# Patient Record
Sex: Female | Born: 1935 | Race: Black or African American | Hispanic: No | Smoking: Current some day smoker
Health system: Southern US, Community
[De-identification: ages and names within clinical notes are randomized; demographics above are authoritative.]

## PROBLEM LIST (undated history)

## (undated) ENCOUNTER — Ambulatory Visit: Admission: EM | Payer: 59

## (undated) DIAGNOSIS — E079 Disorder of thyroid, unspecified: Secondary | ICD-10-CM

## (undated) DIAGNOSIS — I519 Heart disease, unspecified: Secondary | ICD-10-CM

## (undated) DIAGNOSIS — D0510 Intraductal carcinoma in situ of unspecified breast: Principal | ICD-10-CM

## (undated) DIAGNOSIS — IMO0002 Reserved for concepts with insufficient information to code with codable children: Secondary | ICD-10-CM

## (undated) DIAGNOSIS — C50919 Malignant neoplasm of unspecified site of unspecified female breast: Secondary | ICD-10-CM

## (undated) DIAGNOSIS — I10 Essential (primary) hypertension: Secondary | ICD-10-CM

## (undated) HISTORY — DX: Malignant neoplasm of unspecified site of unspecified female breast: C50.919

## (undated) HISTORY — PX: CHOLECYSTECTOMY: SHX55

## (undated) HISTORY — DX: Heart disease, unspecified: I51.9

## (undated) HISTORY — DX: Intraductal carcinoma in situ of unspecified breast: D05.10

## (undated) HISTORY — DX: Reserved for concepts with insufficient information to code with codable children: IMO0002

## (undated) HISTORY — PX: MASTECTOMY: SHX3

## (undated) HISTORY — DX: Disorder of thyroid, unspecified: E07.9

## (undated) HISTORY — DX: Essential (primary) hypertension: I10

## (undated) HISTORY — PX: ABDOMINAL HYSTERECTOMY: SHX81

---

## 2001-03-03 ENCOUNTER — Ambulatory Visit (HOSPITAL_COMMUNITY): Admission: RE | Admit: 2001-03-03 | Discharge: 2001-03-03 | Payer: Self-pay | Admitting: Family Medicine

## 2001-03-03 ENCOUNTER — Encounter: Payer: Self-pay | Admitting: Family Medicine

## 2002-03-18 ENCOUNTER — Encounter: Payer: Self-pay | Admitting: Family Medicine

## 2002-03-18 ENCOUNTER — Ambulatory Visit (HOSPITAL_COMMUNITY): Admission: RE | Admit: 2002-03-18 | Discharge: 2002-03-18 | Payer: Self-pay | Admitting: Family Medicine

## 2003-03-22 ENCOUNTER — Encounter: Payer: Self-pay | Admitting: Family Medicine

## 2003-03-22 ENCOUNTER — Ambulatory Visit (HOSPITAL_COMMUNITY): Admission: RE | Admit: 2003-03-22 | Discharge: 2003-03-22 | Payer: Self-pay | Admitting: Family Medicine

## 2004-02-21 ENCOUNTER — Ambulatory Visit (HOSPITAL_COMMUNITY): Admission: RE | Admit: 2004-02-21 | Discharge: 2004-02-21 | Payer: Self-pay | Admitting: Ophthalmology

## 2004-03-24 ENCOUNTER — Ambulatory Visit (HOSPITAL_COMMUNITY): Admission: RE | Admit: 2004-03-24 | Discharge: 2004-03-24 | Payer: Self-pay | Admitting: Family Medicine

## 2004-06-05 ENCOUNTER — Ambulatory Visit (HOSPITAL_COMMUNITY): Admission: RE | Admit: 2004-06-05 | Discharge: 2004-06-05 | Payer: Self-pay | Admitting: Ophthalmology

## 2004-10-05 ENCOUNTER — Ambulatory Visit (HOSPITAL_COMMUNITY): Admission: RE | Admit: 2004-10-05 | Discharge: 2004-10-05 | Payer: Self-pay | Admitting: General Surgery

## 2005-01-21 ENCOUNTER — Emergency Department (HOSPITAL_COMMUNITY): Admission: EM | Admit: 2005-01-21 | Discharge: 2005-01-21 | Payer: Self-pay | Admitting: Emergency Medicine

## 2005-01-22 ENCOUNTER — Ambulatory Visit: Payer: Self-pay | Admitting: Orthopedic Surgery

## 2005-02-09 ENCOUNTER — Ambulatory Visit (HOSPITAL_COMMUNITY): Admission: RE | Admit: 2005-02-09 | Discharge: 2005-02-09 | Payer: Self-pay | Admitting: Family Medicine

## 2005-02-19 ENCOUNTER — Ambulatory Visit: Payer: Self-pay | Admitting: *Deleted

## 2005-02-19 ENCOUNTER — Ambulatory Visit: Payer: Self-pay | Admitting: Orthopedic Surgery

## 2005-03-07 ENCOUNTER — Ambulatory Visit (HOSPITAL_COMMUNITY): Admission: RE | Admit: 2005-03-07 | Discharge: 2005-03-07 | Payer: Self-pay | Admitting: *Deleted

## 2005-03-07 ENCOUNTER — Ambulatory Visit: Payer: Self-pay | Admitting: Cardiology

## 2005-03-07 ENCOUNTER — Ambulatory Visit: Payer: Self-pay | Admitting: *Deleted

## 2005-03-12 ENCOUNTER — Ambulatory Visit: Payer: Self-pay | Admitting: *Deleted

## 2005-05-23 ENCOUNTER — Ambulatory Visit: Payer: Self-pay | Admitting: Orthopedic Surgery

## 2005-10-22 DIAGNOSIS — C50919 Malignant neoplasm of unspecified site of unspecified female breast: Secondary | ICD-10-CM

## 2005-10-22 HISTORY — DX: Malignant neoplasm of unspecified site of unspecified female breast: C50.919

## 2005-12-17 ENCOUNTER — Ambulatory Visit: Payer: Self-pay | Admitting: Orthopedic Surgery

## 2005-12-19 ENCOUNTER — Emergency Department (HOSPITAL_COMMUNITY): Admission: EM | Admit: 2005-12-19 | Discharge: 2005-12-19 | Payer: Self-pay | Admitting: Emergency Medicine

## 2006-01-11 ENCOUNTER — Ambulatory Visit (HOSPITAL_COMMUNITY): Admission: RE | Admit: 2006-01-11 | Discharge: 2006-01-11 | Payer: Self-pay | Admitting: Family Medicine

## 2006-02-06 ENCOUNTER — Ambulatory Visit (HOSPITAL_COMMUNITY): Admission: RE | Admit: 2006-02-06 | Discharge: 2006-02-06 | Payer: Self-pay | Admitting: Family Medicine

## 2006-02-11 ENCOUNTER — Encounter: Admission: RE | Admit: 2006-02-11 | Discharge: 2006-02-11 | Payer: Self-pay | Admitting: Family Medicine

## 2006-02-11 ENCOUNTER — Encounter (INDEPENDENT_AMBULATORY_CARE_PROVIDER_SITE_OTHER): Payer: Self-pay | Admitting: *Deleted

## 2006-02-20 ENCOUNTER — Ambulatory Visit (HOSPITAL_COMMUNITY): Admission: RE | Admit: 2006-02-20 | Discharge: 2006-02-20 | Payer: Self-pay | Admitting: Family Medicine

## 2006-03-08 ENCOUNTER — Encounter: Admission: RE | Admit: 2006-03-08 | Discharge: 2006-03-08 | Payer: Self-pay | Admitting: General Surgery

## 2006-03-08 ENCOUNTER — Encounter (INDEPENDENT_AMBULATORY_CARE_PROVIDER_SITE_OTHER): Payer: Self-pay | Admitting: Diagnostic Radiology

## 2006-03-08 ENCOUNTER — Encounter (INDEPENDENT_AMBULATORY_CARE_PROVIDER_SITE_OTHER): Payer: Self-pay | Admitting: Specialist

## 2006-03-15 ENCOUNTER — Encounter (INDEPENDENT_AMBULATORY_CARE_PROVIDER_SITE_OTHER): Payer: Self-pay | Admitting: Specialist

## 2006-03-15 ENCOUNTER — Inpatient Hospital Stay (HOSPITAL_COMMUNITY): Admission: RE | Admit: 2006-03-15 | Discharge: 2006-03-18 | Payer: Self-pay | Admitting: General Surgery

## 2006-05-20 ENCOUNTER — Encounter: Admission: RE | Admit: 2006-05-20 | Discharge: 2006-05-20 | Payer: Self-pay | Admitting: Oncology

## 2006-05-20 ENCOUNTER — Ambulatory Visit (HOSPITAL_COMMUNITY): Payer: Self-pay | Admitting: Oncology

## 2006-08-26 ENCOUNTER — Encounter (HOSPITAL_COMMUNITY): Admission: RE | Admit: 2006-08-26 | Discharge: 2006-09-25 | Payer: Self-pay | Admitting: Oncology

## 2006-08-26 ENCOUNTER — Ambulatory Visit (HOSPITAL_COMMUNITY): Payer: Self-pay | Admitting: Oncology

## 2006-08-26 ENCOUNTER — Encounter: Admission: RE | Admit: 2006-08-26 | Discharge: 2006-08-26 | Payer: Self-pay | Admitting: Oncology

## 2007-02-13 ENCOUNTER — Ambulatory Visit (HOSPITAL_COMMUNITY): Admission: RE | Admit: 2007-02-13 | Discharge: 2007-02-13 | Payer: Self-pay | Admitting: Internal Medicine

## 2007-03-26 ENCOUNTER — Ambulatory Visit (HOSPITAL_COMMUNITY): Payer: Self-pay | Admitting: Oncology

## 2007-03-26 ENCOUNTER — Encounter (HOSPITAL_COMMUNITY): Admission: RE | Admit: 2007-03-26 | Discharge: 2007-04-25 | Payer: Self-pay | Admitting: Oncology

## 2007-05-16 ENCOUNTER — Ambulatory Visit (HOSPITAL_COMMUNITY): Admission: RE | Admit: 2007-05-16 | Discharge: 2007-05-16 | Payer: Self-pay | Admitting: Internal Medicine

## 2008-02-06 ENCOUNTER — Ambulatory Visit (HOSPITAL_COMMUNITY): Payer: Self-pay | Admitting: Oncology

## 2008-02-06 ENCOUNTER — Encounter (HOSPITAL_COMMUNITY): Admission: RE | Admit: 2008-02-06 | Discharge: 2008-03-07 | Payer: Self-pay | Admitting: Oncology

## 2008-04-06 ENCOUNTER — Ambulatory Visit (HOSPITAL_COMMUNITY): Admission: RE | Admit: 2008-04-06 | Discharge: 2008-04-06 | Payer: Self-pay | Admitting: Internal Medicine

## 2008-06-17 ENCOUNTER — Ambulatory Visit: Payer: Self-pay | Admitting: Orthopedic Surgery

## 2008-06-17 DIAGNOSIS — M545 Low back pain, unspecified: Secondary | ICD-10-CM | POA: Insufficient documentation

## 2008-06-17 DIAGNOSIS — M542 Cervicalgia: Secondary | ICD-10-CM

## 2008-06-24 ENCOUNTER — Encounter (HOSPITAL_COMMUNITY): Admission: RE | Admit: 2008-06-24 | Discharge: 2008-07-21 | Payer: Self-pay | Admitting: Orthopedic Surgery

## 2008-06-24 ENCOUNTER — Encounter: Payer: Self-pay | Admitting: Orthopedic Surgery

## 2008-07-22 ENCOUNTER — Encounter (HOSPITAL_COMMUNITY): Admission: RE | Admit: 2008-07-22 | Discharge: 2008-08-03 | Payer: Self-pay | Admitting: Orthopedic Surgery

## 2008-08-03 ENCOUNTER — Encounter: Payer: Self-pay | Admitting: Orthopedic Surgery

## 2008-08-06 ENCOUNTER — Ambulatory Visit (HOSPITAL_COMMUNITY): Payer: Self-pay | Admitting: Oncology

## 2009-06-20 ENCOUNTER — Ambulatory Visit (HOSPITAL_COMMUNITY): Admission: RE | Admit: 2009-06-20 | Discharge: 2009-06-20 | Payer: Self-pay | Admitting: Internal Medicine

## 2009-08-05 ENCOUNTER — Ambulatory Visit (HOSPITAL_COMMUNITY): Payer: Self-pay | Admitting: Oncology

## 2010-04-06 ENCOUNTER — Ambulatory Visit (HOSPITAL_COMMUNITY): Admission: RE | Admit: 2010-04-06 | Discharge: 2010-04-06 | Payer: Self-pay | Admitting: Internal Medicine

## 2010-08-22 ENCOUNTER — Ambulatory Visit (HOSPITAL_COMMUNITY): Admission: RE | Admit: 2010-08-22 | Discharge: 2010-08-22 | Payer: Self-pay | Admitting: Internal Medicine

## 2010-09-11 ENCOUNTER — Ambulatory Visit (HOSPITAL_COMMUNITY): Admission: RE | Admit: 2010-09-11 | Discharge: 2010-09-11 | Payer: Self-pay | Admitting: Internal Medicine

## 2010-11-08 ENCOUNTER — Ambulatory Visit (HOSPITAL_COMMUNITY): Admit: 2010-11-08 | Payer: Self-pay | Admitting: Oncology

## 2010-11-08 ENCOUNTER — Encounter (HOSPITAL_COMMUNITY): Admission: RE | Admit: 2010-11-08 | Payer: Self-pay | Source: Home / Self Care | Admitting: Oncology

## 2010-11-28 ENCOUNTER — Ambulatory Visit (HOSPITAL_COMMUNITY): Payer: Medicare Other | Admitting: Oncology

## 2010-11-28 DIAGNOSIS — C50919 Malignant neoplasm of unspecified site of unspecified female breast: Secondary | ICD-10-CM

## 2010-11-29 ENCOUNTER — Other Ambulatory Visit (HOSPITAL_COMMUNITY): Payer: Self-pay | Admitting: Oncology

## 2010-11-29 DIAGNOSIS — Z139 Encounter for screening, unspecified: Secondary | ICD-10-CM

## 2011-03-09 NOTE — Discharge Summary (Signed)
NAME:  Ariana Carney, Ariana Carney             ACCOUNT NO.:  [id number]   MEDICAL RECORD NO.:  [id number]          PATIENT TYPE:  INP   LOCATION:  A329                          FACILITY:  [facility]   PHYSICIAN:  Dirk Dress. Katrinka Blazing, M.D.   DATE OF BIRTH:  Sep 27, 1920   DATE OF ADMISSION:  03/15/2006  DATE OF DISCHARGE:  05/28/2007LH                                 DISCHARGE SUMMARY   DISCHARGE DIAGNOSES:  1.  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  2.  Hypertension.  3.  Irritable bowel syndrome.  4.  Hypothyroidism.  5.  Gastroesophageal reflux disease.   PROCEDURE:  Right total mastectomy with axillary node dissection on May 25.   DISPOSITION:  The patient discharged home in stable satisfactory condition.   DISCHARGE MEDICATIONS:  1.  Digoxin 0.25 mg daily.  2.  Carafate 1 g before meals and at bedtime.  3.  Levothroid 25 mcg daily.  4.  Lortab one every 4-6 hours as needed for pain.   FOLLOW UP:  The patient is scheduled to be seen in the office in 2 weeks.   HOSPITAL COURSE:  An 77 year old female with a history of abnormal mammogram  with initial stereotactic biopsy on April 23, which showed atypical ductal  hyperplasia with microcalcification.  The patient was scheduled to have a  needle localization with partial mastectomy initially, however, an MRI was  done and she was found to have a second lesion in the same breath with  repeat stereotactic biopsy showing ductal carcinoma in situ.  It was felt  that the patient had disease in two quadrants and that she would benefit  from a total mastectomy.  After much discussion with her and her daughters,  the patient was admitted for the surgery.  Other specifics of her history  are given in the admission note.  The patient was admitted through day  surgery and on May 25,  a right total mastectomy with node dissection was  done uneventfully.  The patient had a very smooth postoperative  course.  She  had no perioperative problems.  She had no complaints.  By the postop day  #2, her JP drainage was mostly serous.  She had no difficulty.  Hemoglobin  remained stable and she was discharged home on May 28, in satisfactory  condition.     Dirk Dress. Katrinka Blazing, M.D.  Electronically Signed    LCS/MEDQ  D:  04/21/2006  T:  04/22/2006  Job:  [id number]

## 2011-03-09 NOTE — Procedures (Signed)
NAME:  Ariana Carney, Ariana Carney NO.:  [id number]   MEDICAL RECORD NO.:  [id number]          PATIENT TYPE:  OUT   LOCATION:                                 FACILITY:   PHYSICIAN:  Vida Roller, M.D.   DATE OF BIRTH:  02/17/1920   DATE OF PROCEDURE:  03/07/2005  DATE OF DISCHARGE:                                  ECHOCARDIOGRAM   PROCEDURE:  Stress echocardiogram.   PRIMARY CARE PHYSICIAN:  Annia Friendly. Hill, MD   TAPE NUMBER:  SE [id number].   TAPE COUNT:  [id number]   CLINICAL INFORMATION:  This is an 77 year old woman with an abnormal  electrocardiogram with no previous cardiac history.   DETAILS OF THE PROCEDURE:  The patient was brought to the echocardiographic  laboratory where echocardiographic images were obtained in the apical-4,  apical-2, parasternal-long and parasternal-short axis views.  The patient  then underwent an exercise Bruce protocol and at peak exercise she was  immediately returned to the echocardiographic table where the same views  were obtained and obtained with the rest images to obtain an evaluation for  ischemia  The patient was then recovered appropriately for a Bruce protocol.   RESULTS:   IMAGES:  Rest images revealed normal systolic function with mild left  ventricular hypertrophy.  There are no wall motion abnormalities and the  ejection fraction is in excess of 60%.   STRESS IMAGES:  There was appropriate augmentation of the systolic function.  There was no evidence of exercise induced wall motion abnormality.   STRESS TEST:  The patient was able to exercise 7 minutes and 34 seconds of a  Bruce protocol attaining 10 METS of exercise.  Her heart rate increased from  75 beats/minute to 144 beats/minute, which is 105% of her maximum predicted  heart rate for her age.  Her blood pressure went from 130/68 to 172/78 given  an interval product of 24,000.  During that time she had no chest pain or  shortness of breath. She did have an occasional  ventricular couplet.  There  was some nonspecific ST-T wave changes, mostly in the inferior lateral  leads, which were not diagnostic for ischemia.  The reason for stopping the  test was fatigue and attaining greater than 100% of the maximum predicted  heart rate.   OVERALL INTERPRETATION:  This is an extremely low risk stress echocardiogram  in a patient with no known coronary artery disease, clinical correlation is  advised, but there is no evidence of ischemia or scar formation; and the  likelihood of obstructive coronary disease is extremely low.      JH/MEDQ  D:  03/07/2005  T:  03/07/2005  Job:  [id number]   cc:   Annia Friendly. Loleta Chance, MD  [address]  [place]  [place] [address]  Fax: [phone]

## 2011-03-09 NOTE — H&P (Signed)
NAME:  Ariana Carney, Ariana Carney             ACCOUNT NO.:  [id number]   MEDICAL RECORD NO.:  [id number]          PATIENT TYPE:  AMB   LOCATION:  DAY                           FACILITY:  [facility]   PHYSICIAN:  Jerolyn Shin C. Katrinka Blazing, M.D.   DATE OF BIRTH:  [birth year]   DATE OF ADMISSION:  DATE OF DISCHARGE:  LH                                HISTORY & PHYSICAL   HISTORY OF PRESENT ILLNESS:  An 77 year old female with a history of an  abnormal mammogram with initial stereotactic biopsy on February 11, 2006  showing atypical ductal hyperplasia with microcalcification.  The patient  was scheduled to have needle localization with partial mastectomy initially;  however, after MRI, she was found to have a second lesion in the same breast  with a repeat stereotactic biopsy showing ductal carcinoma in situ.  Sister  lesions were involving 2 quadrants.  It was decided that the patient would  benefit from a total mastectomy rather than wide excision of 2 biopsies.  It  is felt that partial removal of 2 quadrants of her breast on the same side  would leave a very displeasing cosmetic deformity, and the patient would  still need to have follow up radiation therapy.  With total mastectomy, she  would not need to have radiation therapy with this early lesion, and surgery  probably would be therapeutic.  This was discussed at length with the  patient and her daughters.  They agreed to proceed with total mastectomy and  node dissection.   PAST HISTORY:  1.  Hypertension.  2.  Irritable bowel syndrome.  3.  Hypothyroidism.  4.  Gastroesophageal reflux disease.  5.  Osteoarthritis.   MEDICATIONS:  1.  Digoxin 0.25 mg daily.  2.  Uniretic 7.5/12.5 daily.  3.  Hydrocodone 7.5/500 q.4h. p.r.n.   SURGERY:  1.  Exploratory laparotomy with closure.  2.  Perforated duodenal ulcer.  3.  Cholecystectomy.   PHYSICAL EXAMINATION:  GENERAL:  An elderly female in no acute distress.  VITAL SIGNS:  Blood pressure of 136/74, pulse  78, respirations 20, weight  117 pounds.  HEENT:  Unremarkable.  NECK:  Supple.  No JVD, bruit or adenopathy.  CHEST:  Clear to auscultation.  HEART:  Regular rate and rhythm without murmur, gallop, or rub.  ABDOMEN:  Soft, nontender, no masses.  BREASTS:  Palpable mass in the right upper outer quadrant with increased  ecchymosis and large hematoma in the right lower outer quadrant of the right  breast.  No palpable lymphadenopathy.  Left breast normal.  EXTREMITIES:  No clubbing, cyanosis, or edema.  NEUROLOGIC:  No focal motor, sensory or cerebellar deficit.   IMPRESSION:  1.  Multicentric ductal carcinoma in situ, right breast.  2.  Hypertension.  3.  Irritable bowel syndrome.  4.  Hypothyroidism.  5.  Gastroesophageal reflux disease.   PLAN:  Right total mastectomy with node dissection.      Dirk Dress. Katrinka Blazing, M.D.  Electronically Signed     LCS/MEDQ  D:  03/14/2006  T:  03/14/2006  Job:  [id number]

## 2011-03-09 NOTE — Op Note (Signed)
NAME:  Ariana Carney, Ariana Carney             ACCOUNT NO.:  [id number]   MEDICAL RECORD NO.:  [id number]          PATIENT TYPE:  INP   LOCATION:  A329                          FACILITY:  [facility]   PHYSICIAN:  Dirk Dress. Katrinka Blazing, M.D.   DATE OF BIRTH:  June 27, 1920   DATE OF PROCEDURE:  03/15/2006  DATE OF DISCHARGE:  03/18/2006                                 OPERATIVE REPORT   PREOPERATIVE DIAGNOSIS:  Multicentric right breast carcinoma.   POSTOPERATIVE DIAGNOSIS:  Multicentric right breast carcinoma.   PROCEDURE:  Right total mastectomy with axillary node dissection.   SURGEON:  Dirk Dress. Katrinka Blazing, M.D.   DESCRIPTION:  Under general anesthesia, the patient's right breast, axilla,  arm and neck were prepped and draped in a sterile field.  An elliptical  incision was made with a transverse orientation, with slight angulation  towards the right axilla along the lateral pectoral border.  Upper and lower  skin flaps were developed.  Dissection was continued down to the chest wall.  The breast was then separated from the pectoralis fascia without difficulty.  The dissection was continued over to the lateral pectoralis margin.  Dissection was then extended into the axilla down to the deep axillary  fascia.  All fibroareolar tissue inferior to the axillary vein was  dissected.  Vessels were clipped with hemoclips.  Visible lymphatics were  clipped with hemoclips and divided.  Axillary contents were removed.  Thoracodorsal neurovascular bundle and the long thoracic nerve of Bell were  preserved.  Irrigation was carried out.  Two JP drains were placed.  These  were secured with 3-0 nylon.  The skin and subcutaneous tissue were then  closed over the drains using 2-0 Monocryl , 3-0 Monocryl and staples.  The  drains were hooked to suction.  Dressings were placed.  The patient  tolerated the procedure well.  She was awakened from anesthesia  uneventfully, transferred to a bed and taken to the postanesthetic care  unit  in satisfactory condition.      Dirk Dress. Katrinka Blazing, M.D.  Electronically Signed     LCS/MEDQ  D:  04/21/2006  T:  04/22/2006  Job:  [id number]

## 2011-03-09 NOTE — Procedures (Signed)
NAME:  STEELE, LEDONNE NO.:  [id number]   MEDICAL RECORD NO.:  [id number]          PATIENT TYPE:  OUT   LOCATION:  RAD                           FACILITY:  [facility]   PHYSICIAN:  Vida Roller, M.D.   DATE OF BIRTH:  02/17/1920   DATE OF PROCEDURE:  03/07/2005  DATE OF DISCHARGE:                                    STRESS TEST   HISTORY:  Ms. Janvrin is an 77 year old female with no known coronary disease  who was preoperative for a knee surgery.  Her cardiac risk factors include  tobacco abuse, age, and hypertension.   Her resting electrocardiogram reveals a sinus rhythm at 65 beats per minute  with some nonspecific ST abnormalities.  Blood pressure is 130/68.   Patient exercised for a total of 7 minutes and 34 seconds Bruce protocol  stage III and 10.1 METS.  Maximum heart rate achieved was 144 beats per  minute which is 105% of predicted maximum.  Maximum blood pressure is 172/78  and resolved down to 138/70 in recovery.  The patient denied any chest  discomfort or shortness of breath with exercise.  Exercise was stopped  secondary to fatigue and leg discomfort.  Electrocardiogram revealed one  ventricular couplet in recovery.  She had mild ST depression in the  inferolateral leads which is possibly related to rate.   Echocardiographic images and final results are pending M.D. review.      AB/MEDQ  D:  03/07/2005  T:  03/07/2005  Job:  [id number]

## 2011-07-17 LAB — COMPREHENSIVE METABOLIC PANEL
AST: 21
Albumin: 4
Calcium: 9.2
Chloride: 102
Creatinine, Ser: 0.82
GFR calc Af Amer: >60
Sodium: 138

## 2011-07-17 LAB — CBC
MCHC: 35.2
MCV: 92.4
Platelets: 245
WBC: 3.9 — ABNORMAL LOW

## 2011-07-30 ENCOUNTER — Ambulatory Visit (HOSPITAL_COMMUNITY): Payer: Medicare Other | Admitting: Oncology

## 2011-07-31 ENCOUNTER — Encounter (HOSPITAL_COMMUNITY): Payer: Medicare Other | Admitting: Oncology

## 2011-08-09 LAB — CBC
Hemoglobin: 11.2 — ABNORMAL LOW
MCHC: 34.7
MCV: 93.6
RBC: 3.46 — ABNORMAL LOW

## 2011-08-09 LAB — COMPREHENSIVE METABOLIC PANEL
BUN: 12
CO2: 27
Calcium: 8.9
Creatinine, Ser: 0.66
GFR calc non Af Amer: >60
Glucose, Bld: 85

## 2011-08-22 ENCOUNTER — Encounter (HOSPITAL_COMMUNITY): Payer: Self-pay | Admitting: Oncology

## 2011-08-22 ENCOUNTER — Encounter (HOSPITAL_COMMUNITY): Payer: Medicare Other | Attending: Oncology | Admitting: Oncology

## 2011-08-22 VITALS — BP 157/78 | HR 65 | Temp 97.7°F | Ht 62.0 in | Wt 133.0 lb

## 2011-08-22 DIAGNOSIS — D0510 Intraductal carcinoma in situ of unspecified breast: Secondary | ICD-10-CM | POA: Insufficient documentation

## 2011-08-22 DIAGNOSIS — D0590 Unspecified type of carcinoma in situ of unspecified breast: Secondary | ICD-10-CM

## 2011-08-22 HISTORY — DX: Intraductal carcinoma in situ of unspecified breast: D05.10

## 2011-08-22 NOTE — Progress Notes (Signed)
[name], MD [address] [place] [address]  1. DCIS (ductal carcinoma in situ) of right breast     CURRENT THERAPY: S/P modified radical mastectomy on 03/15/2006 followed by Tamoxifen for a while, but discontinued due to intolerance in April 2009.  INTERVAL HISTORY: STORY CONTI 77 y.o. female returns for  regular  visit for followup of DCIS of right breast.  Patient continues to cook.  It appears as though the patient leads a sedentary lifestyle.  She reports that she passes time by watching television.  She denies any complaints this morning.  She explains that she feels good.  She saw Dr. Ouida Sills this morning he he gave her a good report.  She explains that her bowels are operating appropriately and she denies any urinary complaints.   She does reports some tenderness near the left mastectomy site, but she associates it with arthritis.    Past Medical History  Diagnosis Date  . Heart disease   . Hypertension   . Ulcer   . Thyroid disease   . Breast cancer 2007    right  . 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      has no known allergies.  Ms. Heater does not currently have medications on file.  Past Surgical History  Procedure Date  . Cholecystectomy   . Abdominal hysterectomy     Denies any headaches, dizziness, double vision, fevers, chills, night sweats, nausea, vomiting, diarrhea, constipation, chest pain, heart palpitations, shortness of breath, blood in stool, black tarry stool, urinary pain, urinary burning, urinary frequency, hematuria.   PHYSICAL EXAMINATION  ECOG PERFORMANCE STATUS: 1 - Symptomatic but completely ambulatory  Filed Vitals:   08/22/11 1048  BP: 157/78  Pulse: 65  Temp: 97.7 F (36.5 C)    GENERAL:alert, no distress, well nourished, well developed, comfortable, cooperative and  smiling SKIN: skin color, texture, turgor are normal HEAD: Normocephalic EYES: normal EARS: External ears normal OROPHARYNX:edentulous and mucous membranes are moist  NECK: supple, no adenopathy, non-tender, trachea midline LYMPH:  no palpable lymphadenopathy BREAST:left breast normal without mass, skin or nipple changes or axillary nodes,right  post-mastectomy site well healed and free of suspicious changes with a slight tightness of the scar in the superior portion.  No point tenderness noted.  No reproduction of the discomfort on palpation.  No worrisome findings. LUNGS: clear to auscultation , decreased breath sounds throughout. HEART: regular rate & rhythm, no murmurs, no gallops, S1 normal and S2 normal ABDOMEN:abdomen soft, non-tender and normal bowel sounds BACK: Back symmetric, no curvature. EXTREMITIES:less then 2 second capillary refill, no joint deformities, effusion, or inflammation, no edema, no skin discoloration, no clubbing, no cyanosis  NEURO: alert & oriented x 3 with fluent speech, no focal motor/sensory deficits, gait normal   RADIOGRAPHIC STUDIES:  09/11/2010  Clinical Data: Right side chest pain. Lower right rib pain.  RIGHT RIBS AND CHEST - 3+ VIEW  Comparison: Chest 04/06/2010.  Findings: The patient is status post right mastectomy and axillary  dissection. Lungs are emphysematous. There is basilar atelectasis  or scar. No pneumothorax. There is a fracture of the right ninth  rib which appears acute. There is also likely a fracture of the  right eighth rib.  IMPRESSION:  1. Right eighth and ninth rib fractures.  2. No pneumothorax.  3. COPD.  4. Right mastectomy and axillary dissection.  Provider: Melanie Crazier   PATHOLOGY: 1. Right breast, modified radical mastectomy-in situ ductal carcinoma, multifocal with negative surgical margins of resection. No invasive carcinoma identified. No tumor identified in 9 right axillary lymph nodes. Intermediate grade.  Necrosis identified. ER 96% positive PR 9% positive.    ASSESSMENT:  1. Right sided DCIS, S/P modified radical mastectomy on right followed by approximatekly 2 years of tamoxifen which was discontinued in April 2009 due to intolerance. 2. Post-surgical discomfort, possibly due to tight scar in the superior portion of mastectomy site versus arthritis secondary to fibrosis from the surgery   PLAN:  1. We will ascertain lab work from Dr. Alonza Smoker office 2. Return in 1 year for follow-up.   All questions were answered. The patient knows to call the clinic with any problems, questions or concerns. We can certainly see the patient much sooner if necessary.  The patient and plan discussed with Glenford Peers, MD and he is in agreement with the aforementioned.   Natalyn Szymanowski

## 2011-08-22 NOTE — Patient Instructions (Signed)
[facility]  Discharge Instructions  RECOMMENDATIONS MADE BY THE CONSULTANT AND ANY TEST RESULTS WILL BE SENT TO YOUR REFERRING DOCTOR.   EXAM FINDINGS BY MD TODAY AND SIGNS AND SYMPTOMS TO REPORT TO CLINIC OR PRIMARY MD: you are doing well.  Report any lumps, bone pain or shortness of breath.  MEDICATIONS PRESCRIBED: none   SPECIAL INSTRUCTIONS/FOLLOW-UP: Return to Clinic in 1 year.  Have Dr. Ouida Sills send [place] copies of any lab work that you have done.   I acknowledge that I have been informed and understand all the instructions given to me and received a copy. I do not have any more questions at this time, but understand that I may call the Specialty Clinic at [facility] at [phone] during business hours should I have any further questions or need assistance in obtaining follow-up care.    __________________________________________  _____________  __________ Signature of Patient or Authorized Representative            Date                   Time    __________________________________________ Nurse's Signature

## 2011-08-23 ENCOUNTER — Ambulatory Visit (HOSPITAL_COMMUNITY): Payer: Medicare Other | Admitting: Oncology

## 2011-08-27 ENCOUNTER — Ambulatory Visit (HOSPITAL_COMMUNITY): Payer: Medicare Other | Attending: Oncology

## 2011-10-04 ENCOUNTER — Ambulatory Visit (HOSPITAL_COMMUNITY)
Admission: RE | Admit: 2011-10-04 | Discharge: 2011-10-04 | Disposition: A | Discharge status: Home / Self Care | Payer: Medicare Other | Source: Ambulatory Visit | Attending: Oncology | Admitting: Oncology

## 2011-10-04 DIAGNOSIS — Z139 Encounter for screening, unspecified: Secondary | ICD-10-CM

## 2011-10-04 DIAGNOSIS — Z1231 Encounter for screening mammogram for malignant neoplasm of breast: Secondary | ICD-10-CM | POA: Insufficient documentation

## 2011-11-05 DIAGNOSIS — E538 Deficiency of other specified B group vitamins: Secondary | ICD-10-CM | POA: Diagnosis not present

## 2011-12-10 DIAGNOSIS — E538 Deficiency of other specified B group vitamins: Secondary | ICD-10-CM | POA: Diagnosis not present

## 2012-01-01 DIAGNOSIS — G5790 Unspecified mononeuropathy of unspecified lower limb: Secondary | ICD-10-CM | POA: Diagnosis not present

## 2012-01-01 DIAGNOSIS — I10 Essential (primary) hypertension: Secondary | ICD-10-CM | POA: Diagnosis not present

## 2012-01-01 DIAGNOSIS — N318 Other neuromuscular dysfunction of bladder: Secondary | ICD-10-CM | POA: Diagnosis not present

## 2012-01-16 DIAGNOSIS — R209 Unspecified disturbances of skin sensation: Secondary | ICD-10-CM | POA: Diagnosis not present

## 2012-01-16 DIAGNOSIS — I10 Essential (primary) hypertension: Secondary | ICD-10-CM | POA: Diagnosis not present

## 2012-01-16 DIAGNOSIS — E039 Hypothyroidism, unspecified: Secondary | ICD-10-CM | POA: Diagnosis not present

## 2012-01-30 DIAGNOSIS — R209 Unspecified disturbances of skin sensation: Secondary | ICD-10-CM | POA: Diagnosis not present

## 2012-03-03 DIAGNOSIS — E538 Deficiency of other specified B group vitamins: Secondary | ICD-10-CM | POA: Diagnosis not present

## 2012-04-07 DIAGNOSIS — I10 Essential (primary) hypertension: Secondary | ICD-10-CM | POA: Diagnosis not present

## 2012-04-07 DIAGNOSIS — N318 Other neuromuscular dysfunction of bladder: Secondary | ICD-10-CM | POA: Diagnosis not present

## 2012-04-15 DIAGNOSIS — R209 Unspecified disturbances of skin sensation: Secondary | ICD-10-CM | POA: Diagnosis not present

## 2012-04-15 DIAGNOSIS — I10 Essential (primary) hypertension: Secondary | ICD-10-CM | POA: Diagnosis not present

## 2012-04-15 DIAGNOSIS — E039 Hypothyroidism, unspecified: Secondary | ICD-10-CM | POA: Diagnosis not present

## 2012-05-15 DIAGNOSIS — E538 Deficiency of other specified B group vitamins: Secondary | ICD-10-CM | POA: Diagnosis not present

## 2012-06-16 DIAGNOSIS — E538 Deficiency of other specified B group vitamins: Secondary | ICD-10-CM | POA: Diagnosis not present

## 2012-07-21 DIAGNOSIS — E538 Deficiency of other specified B group vitamins: Secondary | ICD-10-CM | POA: Diagnosis not present

## 2012-08-22 ENCOUNTER — Ambulatory Visit (HOSPITAL_COMMUNITY): Payer: Medicare Other | Admitting: Oncology

## 2012-08-25 ENCOUNTER — Ambulatory Visit (HOSPITAL_COMMUNITY): Payer: Medicare Other | Admitting: Oncology

## 2012-09-01 ENCOUNTER — Other Ambulatory Visit (HOSPITAL_COMMUNITY): Payer: Self-pay | Admitting: Oncology

## 2012-09-01 DIAGNOSIS — Z139 Encounter for screening, unspecified: Secondary | ICD-10-CM

## 2012-09-02 ENCOUNTER — Other Ambulatory Visit (HOSPITAL_COMMUNITY): Payer: Self-pay | Admitting: Oncology

## 2012-09-02 ENCOUNTER — Ambulatory Visit (HOSPITAL_COMMUNITY)
Admission: RE | Admit: 2012-09-02 | Discharge: 2012-09-02 | Disposition: A | Discharge status: Home / Self Care | Payer: Medicare Other | Source: Ambulatory Visit | Attending: Oncology | Admitting: Oncology

## 2012-09-02 ENCOUNTER — Encounter (HOSPITAL_COMMUNITY): Payer: Medicare Other | Attending: Oncology | Admitting: Oncology

## 2012-09-02 ENCOUNTER — Encounter (HOSPITAL_COMMUNITY): Payer: Self-pay | Admitting: Oncology

## 2012-09-02 ENCOUNTER — Other Ambulatory Visit (HOSPITAL_COMMUNITY): Payer: Self-pay | Admitting: *Deleted

## 2012-09-02 VITALS — BP 139/85 | HR 79 | Temp 98.0°F | Resp 18 | Wt 125.0 lb

## 2012-09-02 DIAGNOSIS — R5383 Other fatigue: Secondary | ICD-10-CM | POA: Insufficient documentation

## 2012-09-02 DIAGNOSIS — F17200 Nicotine dependence, unspecified, uncomplicated: Secondary | ICD-10-CM | POA: Diagnosis not present

## 2012-09-02 DIAGNOSIS — Z853 Personal history of malignant neoplasm of breast: Secondary | ICD-10-CM | POA: Diagnosis not present

## 2012-09-02 DIAGNOSIS — R059 Cough, unspecified: Secondary | ICD-10-CM | POA: Diagnosis not present

## 2012-09-02 DIAGNOSIS — R634 Abnormal weight loss: Secondary | ICD-10-CM | POA: Insufficient documentation

## 2012-09-02 DIAGNOSIS — D0510 Intraductal carcinoma in situ of unspecified breast: Secondary | ICD-10-CM

## 2012-09-02 DIAGNOSIS — E538 Deficiency of other specified B group vitamins: Secondary | ICD-10-CM | POA: Diagnosis not present

## 2012-09-02 DIAGNOSIS — D0590 Unspecified type of carcinoma in situ of unspecified breast: Secondary | ICD-10-CM | POA: Diagnosis not present

## 2012-09-02 DIAGNOSIS — J4489 Other specified chronic obstructive pulmonary disease: Secondary | ICD-10-CM | POA: Insufficient documentation

## 2012-09-02 DIAGNOSIS — I10 Essential (primary) hypertension: Secondary | ICD-10-CM | POA: Insufficient documentation

## 2012-09-02 DIAGNOSIS — R05 Cough: Secondary | ICD-10-CM | POA: Insufficient documentation

## 2012-09-02 DIAGNOSIS — Z09 Encounter for follow-up examination after completed treatment for conditions other than malignant neoplasm: Secondary | ICD-10-CM | POA: Insufficient documentation

## 2012-09-02 DIAGNOSIS — J449 Chronic obstructive pulmonary disease, unspecified: Secondary | ICD-10-CM | POA: Diagnosis not present

## 2012-09-02 DIAGNOSIS — R5381 Other malaise: Secondary | ICD-10-CM | POA: Diagnosis not present

## 2012-09-02 LAB — CBC WITH DIFFERENTIAL/PLATELET
Basophils Relative: 0 % (ref 0–1)
Eosinophils Absolute: 0 10*3/uL (ref 0.0–0.7)
Lymphs Abs: 1.2 10*3/uL (ref 0.7–4.0)
MCH: 29.4 pg (ref 26.0–34.0)
MCHC: 33.8 g/dL (ref 30.0–36.0)
Neutrophils Relative %: 71 % (ref 43–77)
Platelets: 243 10*3/uL (ref 150–400)
RBC: 4.7 MIL/uL (ref 3.87–5.11)

## 2012-09-02 LAB — COMPREHENSIVE METABOLIC PANEL
ALT: 7 U/L (ref 0–35)
Albumin: 4 g/dL (ref 3.5–5.2)
Alkaline Phosphatase: 54 U/L (ref 39–117)
Potassium: 3.4 mEq/L — ABNORMAL LOW (ref 3.5–5.1)
Sodium: 141 mEq/L (ref 135–145)
Total Protein: 7.4 g/dL (ref 6.0–8.3)

## 2012-09-02 NOTE — Addendum Note (Signed)
Addended byLeida Lauth on: 09/02/2012 12:13 PM   Modules accepted: Orders

## 2012-09-02 NOTE — Progress Notes (Signed)
Ariana Perches, MD [address] [place] [address]  1. DCIS (ductal carcinoma in situ) of right breast     CURRENT THERAPY: Observation  INTERVAL HISTORY: Ariana Carney 78 y.o. female returns for  regular  visit for followup of DCIS of right breast.  S/P modified radical mastectomy on 03/15/2006 followed by Tamoxifen for a while, but discontinued due to intolerance in April 2009.  I personally reviewed and went over radiographic studies with the patient.  Ariana Carney reports that she is weak and tired. This is been progressively worsening over time. She reports that she's not sleeping well. She continues to lose weight and has not have an appetite. She lost approximately 3 kg in one year time.  We need to keep in mind the patient is 78 years old, but I think is important A. shows no other reasons for the symptoms. So we'll perform laboratory work today as described below. We'll also get a chest x-ray since she is a tobacco abuser and continues to smoke 5 cigarettes daily and has been smoking for many many years. She will get her mammogram as scheduled in December of 2013. She will continue to followup with primary care physician as directed.  I provided patient with a sample of boost and ensure to help with her meals supplementation. I've encouraged to drink 3-4 of these a day after she shows the flavor that she enjoys.  She noted discomfort in the right anterior side of chest. On physical exam there is no tenderness palpation or any abnormalities noted on inspection or palpation. On auscultation, there is no pleural rub is appreciated.  Other than the aforementioned, the patient denies any complaints. Complete ROS questioning is negative.   Past Medical History  Diagnosis Date  . Heart disease   . Hypertension   . Ulcer   . Thyroid disease   . Breast cancer 2007    right  . DCIS (ductal carcinoma in situ) of right breast 08/22/2011    has NECK PAIN, CHRONIC; LOW  BACK PAIN; and DCIS (ductal carcinoma in situ) of right breast on her problem list.      has no known allergies.  Ariana Carney had no medications administered during this visit.  Past Surgical History  Procedure Date  . Cholecystectomy   . Abdominal hysterectomy     Denies any headaches, dizziness, double vision, fevers, chills, night sweats, nausea, vomiting, diarrhea, constipation, chest pain, heart palpitations, shortness of breath, blood in stool, black tarry stool, urinary pain, urinary burning, urinary frequency, hematuria.   PHYSICAL EXAMINATION  ECOG PERFORMANCE STATUS: 2 - Symptomatic, <50% confined to bed  Filed Vitals:   09/02/12 1022  BP: 139/85  Pulse: 79  Temp: 98 F (36.7 C)  Resp: 18    GENERAL:alert, no distress, comfortable, cooperative and smiling SKIN: skin color, texture, turgor are normal, no rashes or significant lesions HEAD: Normocephalic, No masses, lesions, tenderness or abnormalities EYES: normal, Conjunctiva are pink and non-injected EARS: External ears normal OROPHARYNX:mucous membranes are moist  NECK: supple, no adenopathy, thyroid normal size, non-tender, without nodularity, no stridor, non-tender, trachea midline LYMPH:  no palpable lymphadenopathy, no hepatosplenomegaly BREAST:left breast normal without mass, skin or nipple changes or axillary nodes, right post-mastectomy site well healed and free of suspicious changes LUNGS: clear to auscultation and percussion HEART: regular rate & rhythm, no murmurs, no gallops, S1 normal and S2 normal ABDOMEN:abdomen soft, non-tender, normal bowel sounds, no masses or organomegaly and no hepatosplenomegaly BACK: Back  symmetric, no curvature., No CVA tenderness EXTREMITIES:less then 2 second capillary refill, no joint deformities, effusion, or inflammation, no edema, no skin discoloration, no clubbing, no cyanosis  NEURO: alert & oriented x 3 with fluent speech, no focal motor/sensory  deficits   RADIOGRAPHIC STUDIES:  10/04/2011  DG SCREENING MAMMOGRAM LEFT  CC and MLO view(s) were taken of the left breast.  LEFT DIGITAL SCREENING MAMMOGRAM WITH CAD:  There are scattered fibroglandular densities. No masses or malignant type calcifications are  identified. Compared with prior studies.  Images were processed with CAD.  IMPRESSION:  No specific mammographic evidence of malignancy. Next screening mammogram is recommended in one  year.  A result letter of this screening mammogram will be mailed directly to the patient.  ASSESSMENT: Negative - BI-RADS 1  Screening mammogram in 1 year.    PATHOLOGY: 1. Right breast, modified radical mastectomy-in situ ductal carcinoma, multifocal with negative surgical margins of resection. No invasive carcinoma identified. No tumor identified in 9 right axillary lymph nodes. Intermediate grade. Necrosis identified. ER 96% positive PR 9% positive.    ASSESSMENT:  1. Right sided DCIS, S/P modified radical mastectomy on right followed by approximatekly 2 years of tamoxifen which was discontinued in April 2009 due to intolerance.  2. Post-surgical discomfort, possibly due to tight scar in the superior portion of mastectomy site versus arthritis secondary to fibrosis from the surgery 3. Weakness, fatigue 4. Weight loss 5. Tobacco abuse, continued.    PLAN:  1. I personally reviewed and went over radiographic studies with the patient. 2. Lab work today: CBC diff, CMET, Sed rate, LDH, MMA 3. Samples of boost provided 4. Chest X-ray, cough, weakness, weight loss, continued tobacco abuse.  5. Return in 1 year for follow-up.  Continue follow-up with PCP as directed   All questions were answered. The patient knows to call the clinic with any problems, questions or concerns. We can certainly see the patient much sooner if necessary.  The patient and plan discussed with Ariana Peers, MD and he is in agreement with the aforementioned.   Patient seen by Dr. Glenford Carney as well.  Ariana Carney

## 2012-09-02 NOTE — Patient Instructions (Addendum)
[facility]  Discharge Instructions  RECOMMENDATIONS MADE BY THE CONSULTANT AND ANY TEST RESULTS WILL BE SENT TO YOUR REFERRING DOCTOR.   EXAM FINDINGS BY MD TODAY AND SIGNS AND SYMPTOMS TO REPORT TO CLINIC OR PRIMARY MD: Exam today per Samuella Bruin PA  Labs today and we have ordered a chest xray that you can do at your convenience   INSTRUCTIONS GIVEN AND DISCUSSED: We will see you back in 1 year  SPECIAL INSTRUCTIONS/FOLLOW-UP:    I acknowledge that I have been informed and understand all the instructions given to me and received a copy. I do not have any more questions at this time, but understand that I may call the Specialty Clinic at [facility] at [phone] during business hours should I have any further questions or need assistance in obtaining follow-up care.    __________________________________________  _____________  __________ Signature of Patient or Authorized Representative            Date                   Time    __________________________________________ Nurse's Signature

## 2012-10-06 ENCOUNTER — Ambulatory Visit (HOSPITAL_COMMUNITY)
Admission: RE | Admit: 2012-10-06 | Discharge: 2012-10-06 | Disposition: A | Discharge status: Home / Self Care | Payer: Medicare Other | Source: Ambulatory Visit | Attending: Oncology | Admitting: Oncology

## 2012-10-06 DIAGNOSIS — E538 Deficiency of other specified B group vitamins: Secondary | ICD-10-CM | POA: Diagnosis not present

## 2012-10-06 DIAGNOSIS — Z1231 Encounter for screening mammogram for malignant neoplasm of breast: Secondary | ICD-10-CM | POA: Insufficient documentation

## 2012-10-06 DIAGNOSIS — Z139 Encounter for screening, unspecified: Secondary | ICD-10-CM

## 2012-10-20 DIAGNOSIS — C50919 Malignant neoplasm of unspecified site of unspecified female breast: Secondary | ICD-10-CM | POA: Diagnosis not present

## 2012-10-20 DIAGNOSIS — I10 Essential (primary) hypertension: Secondary | ICD-10-CM | POA: Diagnosis not present

## 2012-11-10 DIAGNOSIS — E538 Deficiency of other specified B group vitamins: Secondary | ICD-10-CM | POA: Diagnosis not present

## 2013-01-13 DIAGNOSIS — E538 Deficiency of other specified B group vitamins: Secondary | ICD-10-CM | POA: Diagnosis not present

## 2013-02-26 DIAGNOSIS — E538 Deficiency of other specified B group vitamins: Secondary | ICD-10-CM | POA: Diagnosis not present

## 2013-03-31 DIAGNOSIS — E538 Deficiency of other specified B group vitamins: Secondary | ICD-10-CM | POA: Diagnosis not present

## 2013-04-08 DIAGNOSIS — I10 Essential (primary) hypertension: Secondary | ICD-10-CM | POA: Diagnosis not present

## 2013-04-08 DIAGNOSIS — C50919 Malignant neoplasm of unspecified site of unspecified female breast: Secondary | ICD-10-CM | POA: Diagnosis not present

## 2013-04-08 DIAGNOSIS — E039 Hypothyroidism, unspecified: Secondary | ICD-10-CM | POA: Diagnosis not present

## 2013-04-08 DIAGNOSIS — C50519 Malignant neoplasm of lower-outer quadrant of unspecified female breast: Secondary | ICD-10-CM | POA: Diagnosis not present

## 2013-04-08 DIAGNOSIS — Z79899 Other long term (current) drug therapy: Secondary | ICD-10-CM | POA: Diagnosis not present

## 2013-04-13 DIAGNOSIS — Z79899 Other long term (current) drug therapy: Secondary | ICD-10-CM | POA: Diagnosis not present

## 2013-04-13 DIAGNOSIS — C50919 Malignant neoplasm of unspecified site of unspecified female breast: Secondary | ICD-10-CM | POA: Diagnosis not present

## 2013-04-13 DIAGNOSIS — C50519 Malignant neoplasm of lower-outer quadrant of unspecified female breast: Secondary | ICD-10-CM | POA: Diagnosis not present

## 2013-04-13 DIAGNOSIS — I10 Essential (primary) hypertension: Secondary | ICD-10-CM | POA: Diagnosis not present

## 2013-04-13 DIAGNOSIS — E039 Hypothyroidism, unspecified: Secondary | ICD-10-CM | POA: Diagnosis not present

## 2013-04-20 DIAGNOSIS — I10 Essential (primary) hypertension: Secondary | ICD-10-CM | POA: Diagnosis not present

## 2013-04-20 DIAGNOSIS — E039 Hypothyroidism, unspecified: Secondary | ICD-10-CM | POA: Diagnosis not present

## 2013-05-04 DIAGNOSIS — E538 Deficiency of other specified B group vitamins: Secondary | ICD-10-CM | POA: Diagnosis not present

## 2013-06-09 DIAGNOSIS — H04129 Dry eye syndrome of unspecified lacrimal gland: Secondary | ICD-10-CM | POA: Diagnosis not present

## 2013-06-09 DIAGNOSIS — Z961 Presence of intraocular lens: Secondary | ICD-10-CM | POA: Diagnosis not present

## 2013-06-09 DIAGNOSIS — H35379 Puckering of macula, unspecified eye: Secondary | ICD-10-CM | POA: Diagnosis not present

## 2013-06-09 DIAGNOSIS — E538 Deficiency of other specified B group vitamins: Secondary | ICD-10-CM | POA: Diagnosis not present

## 2013-07-21 DIAGNOSIS — Z23 Encounter for immunization: Secondary | ICD-10-CM | POA: Diagnosis not present

## 2013-07-21 DIAGNOSIS — E538 Deficiency of other specified B group vitamins: Secondary | ICD-10-CM | POA: Diagnosis not present

## 2013-09-01 ENCOUNTER — Ambulatory Visit (HOSPITAL_COMMUNITY): Payer: Medicare Other | Admitting: Oncology

## 2013-09-01 ENCOUNTER — Ambulatory Visit (HOSPITAL_COMMUNITY): Payer: Medicare Other

## 2013-09-01 DIAGNOSIS — E538 Deficiency of other specified B group vitamins: Secondary | ICD-10-CM | POA: Diagnosis not present

## 2013-09-04 ENCOUNTER — Encounter (HOSPITAL_COMMUNITY): Payer: Self-pay

## 2013-10-01 NOTE — Progress Notes (Signed)
Rescheduled

## 2013-10-02 ENCOUNTER — Ambulatory Visit (HOSPITAL_COMMUNITY): Payer: Medicare Other | Admitting: Oncology

## 2013-10-05 NOTE — Progress Notes (Signed)
Ariana Perches, MD [address] [place] [address]  DCIS (ductal carcinoma in situ) of breast, right  CURRENT THERAPY:Observation  INTERVAL HISTORY: Ariana Carney 78 y.o. female returns for  regular  visit for followup of DCIS of right breast. S/P modified radical mastectomy on 03/15/2006 followed by Tamoxifen for a while, but discontinued due to intolerance in April 2009.   Ariana Carney reports that she cooked a large Thanksgiving dinner.  She had a wonderful family gathering for that holiday.  To date, she reports that she does not have any plans for Christmas yet.  She admits that she continues to smoke, "but I do not inhale."  At 79 years old, smoking cessation education is a mute point, but I did recommend from a medical standpoint, that she should quite smoking.   She is due for her annual mammogram this month, and her daughter, who accompanies the patient today, reports that she will get that scheduled for her mother.    She continues to follow-up with Dr.Fagan regularly.  Labs are performed by him and there is no role for labs from our standpoint.    She is agreeable to return in 1 year.  She has absolutely outlived her life expectancy and therefore, we will follow her along and focus on quality of life.  Oncologically, she denies any complaints and ROS questioning is negative.   Past Medical History  Diagnosis Date  . Heart disease   . Hypertension   . Ulcer   . Thyroid disease   . Breast cancer 2007    right  . 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     has No Known Allergies.  Ariana Carney does not currently have medications on file.  Past Surgical History  Procedure Laterality Date  . Cholecystectomy    . Abdominal hysterectomy      Denies any headaches, dizziness, double vision, fevers, chills, night sweats,  nausea, vomiting, diarrhea, constipation, chest pain, heart palpitations, shortness of breath, blood in stool, black tarry stool, urinary pain, urinary burning, urinary frequency, hematuria.   PHYSICAL EXAMINATION  ECOG PERFORMANCE STATUS: 1 - Symptomatic but completely ambulatory  Filed Vitals:   10/06/13 0918  BP: 109/68  Pulse: 70  Temp: 98.2 F (36.8 C)  Resp: 18    GENERAL:alert, no distress, well nourished, well developed, comfortable, cooperative, smiling and younger than her stated age. SKIN: skin color, texture, turgor are normal, no rashes or significant lesions HEAD: Normocephalic, No masses, lesions, tenderness or abnormalities EYES: normal, PERRLA, EOMI, Conjunctiva are pink and non-injected EARS: External ears normal OROPHARYNX:mucous membranes are moist  NECK: supple, no adenopathy, thyroid normal size, non-tender, without nodularity, no stridor, non-tender, trachea midline LYMPH:  no palpable lymphadenopathy, no hepatosplenomegaly BREAST:left breast normal without mass, skin or nipple changes or axillary nodes, right post-mastectomy site well healed and free of suspicious changes LUNGS: clear to auscultation and percussion HEART: regular rate & rhythm, no murmurs, no gallops, S1 normal and S2 normal ABDOMEN:abdomen soft, non-tender, normal bowel sounds, no masses or organomegaly and no hepatosplenomegaly BACK: Back symmetric, no curvature., No CVA tenderness EXTREMITIES:less then 2 second capillary refill, no joint deformities, effusion, or inflammation, no edema, no cyanosis  NEURO: alert & oriented x 3 with fluent speech, no focal motor/sensory deficits    LABORATORY DATA: CBC    Component  Value Date/Time   WBC 5.6 09/02/2012 1111   RBC 4.70 09/02/2012 1111   HGB 13.8 09/02/2012 1111   HCT 40.8 09/02/2012 1111   PLT 243 09/02/2012 1111   MCV 86.8 09/02/2012 1111   MCH 29.4 09/02/2012 1111   MCHC 33.8 09/02/2012 1111   RDW 15.1 09/02/2012 1111    LYMPHSABS 1.2 09/02/2012 1111   MONOABS 0.4 09/02/2012 1111   EOSABS 0.0 09/02/2012 1111   BASOSABS 0.0 09/02/2012 1111      Chemistry      Component Value Date/Time   NA 141 09/02/2012 1111   K 3.4* 09/02/2012 1111   CL 102 09/02/2012 1111   CO2 26 09/02/2012 1111   BUN 13 09/02/2012 1111   CREATININE 0.81 09/02/2012 1111      Component Value Date/Time   CALCIUM 9.4 09/02/2012 1111   ALKPHOS 54 09/02/2012 1111   AST 15 09/02/2012 1111   ALT 7 09/02/2012 1111   BILITOT 1.1 09/02/2012 1111       RADIOGRAPHIC STUDIES:  10/07/2012  *RADIOLOGY REPORT*  Clinical Data: Screening.  MAMMOGRAPHIC UNILATERAL LEFT DIGITAL SCREENING WITH CAD  Comparison: Previous exams.  FINDINGS:  ACR Breast Density Category 2: There is a scattered fibroglandular  pattern.  No suspicious masses, architectural distortion, or calcifications  are present.  Images were processed with CAD.  IMPRESSION:  No mammographic evidence of malignancy.  A result letter of this screening mammogram will be mailed directly  to the patient.  RECOMMENDATION:  Screening mammogram in one year. (Code:SM-B-01Y)  BI-RADS CATEGORY 2: Benign finding(s).  Original Report Authenticated By: Sherian Rein, M.D.    PATHOLOGY: 1. Right breast, modified radical mastectomy-in situ ductal carcinoma, multifocal with negative surgical margins of resection. No invasive carcinoma identified. No tumor identified in 9 right axillary lymph nodes. Intermediate grade. Necrosis identified. ER 96% positive PR 9% positive.    ASSESSMENT:  1. Right sided DCIS, S/P modified radical mastectomy on right followed by approximatekly 2 years of tamoxifen which was discontinued in April 2009 due to intolerance.  2. Post-surgical discomfort, possibly due to tight scar in the superior portion of mastectomy site versus arthritis secondary to fibrosis from the surgery  3. Tobacco abuse, continued. 4. Osteoarthritis of hands  Patient Active  Problem List   Diagnosis Date Noted  . 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 NECK PAIN, CHRONIC 06/17/2008  . LOW BACK PAIN 06/17/2008     PLAN:  1. I personally reviewed and went over laboratory results with the patient. 2. I personally reviewed and went over radiographic studies with the patient. 3. Screening mammogram this month, if she desires 4. Follow-up with PCP as directed.  5. Return in 1 year for follow-up   THERAPY PLAN:  We will follow NCCN guidelines pertaining to surveillance.  NCCN guidelines recommends the following surveillance for invasive breast cancer:  A. History and Physical exam every 4-6 months for 5 years and then every 12 months.  B. Mammography every 12 months  C. Women on Tamoxifen: annual gynecologic assessment every 12 months if uterus is present.  D. Women on aromatase inhibitor or who experience ovarian failure secondary to treatment should have monitoring of bone health with a bone mineral density determination at baseline and periodically thereafter.  E. Assess and encourage adherence to adjuvant endocrine therapy.  F. Evidence suggests that active lifestyle and achieving and maintaining an ideal body weight (20-25 BMI) may lead to optimal breast cancer outcomes.  All questions  were answered. The patient knows to call the clinic with any problems, questions or concerns. We can certainly see the patient much sooner if necessary.  Patient and plan discussed with Dr. Alla German and he is in agreement with the aforementioned.   Jenae Tomasello

## 2013-10-06 ENCOUNTER — Encounter (HOSPITAL_COMMUNITY): Payer: Self-pay | Admitting: Oncology

## 2013-10-06 ENCOUNTER — Encounter (HOSPITAL_COMMUNITY): Payer: Medicare Other | Attending: Oncology | Admitting: Oncology

## 2013-10-06 VITALS — BP 109/68 | HR 70 | Temp 98.2°F | Resp 18 | Wt 124.9 lb

## 2013-10-06 DIAGNOSIS — F17200 Nicotine dependence, unspecified, uncomplicated: Secondary | ICD-10-CM

## 2013-10-06 DIAGNOSIS — D0511 Intraductal carcinoma in situ of right breast: Secondary | ICD-10-CM

## 2013-10-06 DIAGNOSIS — D0590 Unspecified type of carcinoma in situ of unspecified breast: Secondary | ICD-10-CM | POA: Diagnosis not present

## 2013-10-06 DIAGNOSIS — E538 Deficiency of other specified B group vitamins: Secondary | ICD-10-CM | POA: Diagnosis not present

## 2013-10-06 NOTE — Patient Instructions (Signed)
[facility] Discharge Instructions  RECOMMENDATIONS MADE BY THE CONSULTANT AND ANY TEST RESULTS WILL BE SENT TO YOUR REFERRING PHYSICIAN.  EXAM FINDINGS BY THE PHYSICIAN TODAY AND SIGNS OR SYMPTOMS TO REPORT TO CLINIC OR PRIMARY PHYSICIAN: Exam and findings as discussed by Dellis Anes, PA-C. You are doing well.  Get your mammogram which is due this month. Report any new lumps, bone pain, shortness of breath or other symptoms.  MEDICATIONS PRESCRIBED:  none  INSTRUCTIONS/FOLLOW-UP: Follow-up in 1 year.  Thank you for choosing [facility] to provide your oncology and hematology care.  To afford each patient quality time with our providers, please arrive at least 15 minutes before your scheduled appointment time.  With your help, our goal is to use those 15 minutes to complete the necessary work-up to ensure our physicians have the information they need to help with your evaluation and healthcare recommendations.    Effective January 1st, 2014, we ask that you re-schedule your appointment with our physicians should you arrive 10 or more minutes late for your appointment.  We strive to give you quality time with our providers, and arriving late affects you and other patients whose appointments are after yours.    Again, thank you for choosing [facility].  Our hope is that these requests will decrease the amount of time that you wait before being seen by our physicians.       _____________________________________________________________  Should you have questions after your visit to [facility], please contact our office at [phone] between the hours of 8:30 a.m. and 5:00 p.m.  Voicemails left after 4:30 p.m. will not be returned until the following business day.  For prescription refill requests, have your pharmacy contact our office with your prescription refill request.

## 2013-10-27 DIAGNOSIS — G5790 Unspecified mononeuropathy of unspecified lower limb: Secondary | ICD-10-CM | POA: Diagnosis not present

## 2013-10-27 DIAGNOSIS — I10 Essential (primary) hypertension: Secondary | ICD-10-CM | POA: Diagnosis not present

## 2013-10-29 ENCOUNTER — Other Ambulatory Visit (HOSPITAL_COMMUNITY): Payer: Self-pay | Admitting: General Practice

## 2013-10-29 ENCOUNTER — Other Ambulatory Visit (HOSPITAL_COMMUNITY): Payer: Self-pay | Admitting: Internal Medicine

## 2013-10-29 DIAGNOSIS — Z139 Encounter for screening, unspecified: Secondary | ICD-10-CM

## 2013-11-09 ENCOUNTER — Ambulatory Visit (HOSPITAL_COMMUNITY): Payer: Medicare Other

## 2013-11-17 ENCOUNTER — Ambulatory Visit (HOSPITAL_COMMUNITY)
Admission: RE | Admit: 2013-11-17 | Discharge: 2013-11-17 | Disposition: A | Discharge status: Home / Self Care | Payer: Medicare Other | Source: Ambulatory Visit | Attending: Internal Medicine | Admitting: Internal Medicine

## 2013-11-17 DIAGNOSIS — Z1231 Encounter for screening mammogram for malignant neoplasm of breast: Secondary | ICD-10-CM | POA: Diagnosis not present

## 2013-11-17 DIAGNOSIS — E538 Deficiency of other specified B group vitamins: Secondary | ICD-10-CM | POA: Diagnosis not present

## 2013-11-17 DIAGNOSIS — Z139 Encounter for screening, unspecified: Secondary | ICD-10-CM

## 2013-12-22 DIAGNOSIS — E039 Hypothyroidism, unspecified: Secondary | ICD-10-CM | POA: Diagnosis not present

## 2013-12-22 DIAGNOSIS — G609 Hereditary and idiopathic neuropathy, unspecified: Secondary | ICD-10-CM | POA: Diagnosis not present

## 2013-12-22 DIAGNOSIS — E538 Deficiency of other specified B group vitamins: Secondary | ICD-10-CM | POA: Diagnosis not present

## 2014-01-11 DIAGNOSIS — G609 Hereditary and idiopathic neuropathy, unspecified: Secondary | ICD-10-CM | POA: Diagnosis not present

## 2014-01-11 DIAGNOSIS — E538 Deficiency of other specified B group vitamins: Secondary | ICD-10-CM | POA: Diagnosis not present

## 2014-01-11 DIAGNOSIS — M810 Age-related osteoporosis without current pathological fracture: Secondary | ICD-10-CM | POA: Diagnosis not present

## 2014-01-11 DIAGNOSIS — R5383 Other fatigue: Secondary | ICD-10-CM | POA: Diagnosis not present

## 2014-01-11 DIAGNOSIS — Z79899 Other long term (current) drug therapy: Secondary | ICD-10-CM | POA: Diagnosis not present

## 2014-01-11 DIAGNOSIS — R5381 Other malaise: Secondary | ICD-10-CM | POA: Diagnosis not present

## 2014-02-01 DIAGNOSIS — E538 Deficiency of other specified B group vitamins: Secondary | ICD-10-CM | POA: Diagnosis not present

## 2014-02-02 DIAGNOSIS — G609 Hereditary and idiopathic neuropathy, unspecified: Secondary | ICD-10-CM | POA: Diagnosis not present

## 2014-02-02 DIAGNOSIS — E559 Vitamin D deficiency, unspecified: Secondary | ICD-10-CM | POA: Diagnosis not present

## 2014-03-08 DIAGNOSIS — E538 Deficiency of other specified B group vitamins: Secondary | ICD-10-CM | POA: Diagnosis not present

## 2014-04-12 DIAGNOSIS — E039 Hypothyroidism, unspecified: Secondary | ICD-10-CM | POA: Diagnosis not present

## 2014-04-12 DIAGNOSIS — Z79899 Other long term (current) drug therapy: Secondary | ICD-10-CM | POA: Diagnosis not present

## 2014-04-12 DIAGNOSIS — E538 Deficiency of other specified B group vitamins: Secondary | ICD-10-CM | POA: Diagnosis not present

## 2014-04-12 DIAGNOSIS — C50519 Malignant neoplasm of lower-outer quadrant of unspecified female breast: Secondary | ICD-10-CM | POA: Diagnosis not present

## 2014-04-12 DIAGNOSIS — C50919 Malignant neoplasm of unspecified site of unspecified female breast: Secondary | ICD-10-CM | POA: Diagnosis not present

## 2014-04-12 DIAGNOSIS — I10 Essential (primary) hypertension: Secondary | ICD-10-CM | POA: Diagnosis not present

## 2014-04-26 DIAGNOSIS — I10 Essential (primary) hypertension: Secondary | ICD-10-CM | POA: Diagnosis not present

## 2014-04-26 DIAGNOSIS — M66329 Spontaneous rupture of flexor tendons, unspecified upper arm: Secondary | ICD-10-CM | POA: Diagnosis not present

## 2014-04-26 DIAGNOSIS — E039 Hypothyroidism, unspecified: Secondary | ICD-10-CM | POA: Diagnosis not present

## 2014-05-20 DIAGNOSIS — E538 Deficiency of other specified B group vitamins: Secondary | ICD-10-CM | POA: Diagnosis not present

## 2014-07-05 DIAGNOSIS — E538 Deficiency of other specified B group vitamins: Secondary | ICD-10-CM | POA: Diagnosis not present

## 2014-08-17 DIAGNOSIS — E538 Deficiency of other specified B group vitamins: Secondary | ICD-10-CM | POA: Diagnosis not present

## 2014-08-17 DIAGNOSIS — Z23 Encounter for immunization: Secondary | ICD-10-CM | POA: Diagnosis not present

## 2014-09-07 DIAGNOSIS — M25562 Pain in left knee: Secondary | ICD-10-CM | POA: Diagnosis not present

## 2014-09-20 DIAGNOSIS — E538 Deficiency of other specified B group vitamins: Secondary | ICD-10-CM | POA: Diagnosis not present

## 2014-10-03 NOTE — Progress Notes (Signed)
[name], MD [address] [place] [address]  DCIS (ductal carcinoma in situ) of breast, right  CURRENT THERAPY: Observation  INTERVAL HISTORY: Ariana Carney 80 y.o. female returns for  regular  visit for followup of DCIS of right breast. S/P modified radical mastectomy on 03/15/2006 followed by Tamoxifen for a while, but discontinued due to intolerance in April 2009.  I personally reviewed and went over laboratory results with the patient.  The results are noted within this dictation.  I personally reviewed and went over radiographic studies with the patient.  The results are noted within this dictation. Mammogram on 11/18/2013 was BIRADS 1.   The utility of further mammography is questionable since she is not a candidate for chemotherapy, unlikely a candidate for radiation therapy, and unlikely a candidate for surgical intervention. I've encouraged her to continue with annual mammography if this is not a hindrance to her quality of life. If so, she can stop doing mammography.  She has issues with peripheral neuropathy which is being followed by her primary care physician. This is not chemotherapy-induced. She reports that she was tested for diabetes and was negative for that test. I suspect that hemoglobin A1c was performed to verify that by her primary care physician.  The patient's daughter reports that the patient is concerned that we do not tell her everything regarding her cancer care. I've given both education regarding the fact that we do not hide information. As a result, I've copied the patient's mammography report into her discharge instructions for her to have when she leaves the clinic.  Oncologically, the patient denies any complaint and ROS questioning is negative.    Past Medical History  Diagnosis Date  . Heart disease   . Hypertension   . Ulcer   . Thyroid disease   . Breast cancer 2007    right  . 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     has No Known Allergies.  Ms. Hiltunen does not currently have medications on file.  Past Surgical History  Procedure Laterality Date  . Cholecystectomy    . Abdominal hysterectomy      Denies any headaches, dizziness, double vision, fevers, chills, night sweats, nausea, vomiting, diarrhea, constipation, chest pain, heart palpitations, shortness of breath, blood in stool, black tarry stool, urinary pain, urinary burning, urinary frequency, hematuria.   PHYSICAL EXAMINATION  ECOG PERFORMANCE STATUS: 1 - Symptomatic but completely ambulatory  Filed Vitals:   10/06/14 0900  BP: 111/64  Pulse: 72  Temp: 97.9 F (36.6 C)  Resp: 18    GENERAL:alert, no distress, well nourished, well developed, comfortable, cooperative and smiling SKIN: skin color, texture, turgor are normal, no rashes or significant lesions HEAD: Normocephalic, No masses, lesions, tenderness or abnormalities EYES: normal, PERRLA, EOMI, Conjunctiva are pink and non-injected EARS: External ears normal OROPHARYNX:mucous membranes are moist  NECK: supple, trachea midline LYMPH:  no palpable lymphadenopathy BREAST:patient declines to have breast exam LUNGS: clear to auscultation  HEART: regular rate & rhythm ABDOMEN:abdomen soft and normal bowel sounds BACK: Back symmetric, no curvature. EXTREMITIES:less then 2 second capillary refill, no skin discoloration, no cyanosis  NEURO: alert & oriented x 3 with fluent speech, no focal motor/sensory deficits, gait normal   LABORATORY DATA: CBC    Component Value Date/Time   WBC 5.6 09/02/2012  1111   RBC 4.70 09/02/2012 1111   HGB 13.8 09/02/2012 1111   HCT 40.8 09/02/2012 1111   PLT 243 09/02/2012 1111   MCV 86.8 09/02/2012 1111   MCH 29.4 09/02/2012 1111   MCHC 33.8 09/02/2012 1111   RDW 15.1 09/02/2012 1111   LYMPHSABS  1.2 09/02/2012 1111   MONOABS 0.4 09/02/2012 1111   EOSABS 0.0 09/02/2012 1111   BASOSABS 0.0 09/02/2012 1111      Chemistry      Component Value Date/Time   NA 141 09/02/2012 1111   K 3.4* 09/02/2012 1111   CL 102 09/02/2012 1111   CO2 26 09/02/2012 1111   BUN 13 09/02/2012 1111   CREATININE 0.81 09/02/2012 1111      Component Value Date/Time   CALCIUM 9.4 09/02/2012 1111   ALKPHOS 54 09/02/2012 1111   AST 15 09/02/2012 1111   ALT 7 09/02/2012 1111   BILITOT 1.1 09/02/2012 1111       RADIOGRAPHIC STUDIES:  11/18/2013  CLINICAL DATA: Screening.  EXAM: DIGITAL SCREENING UNILATERAL LEFT MAMMOGRAM WITH CAD  COMPARISON: Previous exam(s).  ACR Breast Density Category b: There are scattered areas of fibroglandular density.  FINDINGS: There are no findings suspicious for malignancy. Images were processed with CAD.  IMPRESSION: No mammographic evidence of malignancy. A result letter of this screening mammogram will be mailed directly to the patient.  RECOMMENDATION: Screening mammogram in one year. (Code:SM-B-01Y)  BI-RADS CATEGORY 1: Negative.   Electronically Signed  By: Abelardo Diesel M.D.  On: 11/18/2013 12:28     ASSESSMENT:  1. Right sided DCIS, S/P modified radical mastectomy on right followed by approximatekly 2 years of tamoxifen which was discontinued in April 2009 due to intolerance.  2. Post-surgical discomfort, possibly due to tight scar in the superior portion of mastectomy site versus arthritis secondary to fibrosis from the surgery  3. Tobacco abuse, continued. 4. Osteoarthritis of hands 5. Weight loss, not a candidate for Megace, Marinol, or corticosteroids due to risks outweighing benefits.  Patient Active Problem List   Diagnosis Date Noted  . 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 NECK PAIN, CHRONIC 06/17/2008  . LOW BACK PAIN 06/17/2008      PLAN:  1. I personally reviewed and went over  laboratory results with the patient. 2. I personally reviewed and went over radiographic studies with the patient. 3. Screening mammogram next month, if she desires.  Future utility of mammography is questionable. 4. Follow-up with PCP as directed.  5. Return in 1 year for follow-up   THERAPY PLAN:  NCCN guidelines recommends the following surveillance for invasive breast cancer:  A. History and Physical exam every 4-6 months for 5 years and then every 12 months.  B. Mammography every 12 months  C. Women on Tamoxifen: annual gynecologic assessment every 12 months if uterus is present.  D. Women on aromatase inhibitor or who experience ovarian failure secondary to treatment should have monitoring of bone health with a bone mineral density determination at baseline and periodically thereafter.  E. Assess and encourage adherence to adjuvant endocrine therapy.  F. Evidence suggests that active lifestyle and achieving and maintaining an ideal body weight (20-25 BMI) may lead to optimal breast cancer outcomes.   All questions were answered. The patient knows to call the clinic with any problems, questions or concerns. We can certainly see the patient much sooner if necessary.  Patient and plan discussed with Dr. Farrel Gobble and he is in agreement with  the aforementioned.   Zacharee Gaddie 10/06/2014

## 2014-10-06 ENCOUNTER — Encounter (HOSPITAL_COMMUNITY): Payer: Self-pay | Admitting: Oncology

## 2014-10-06 ENCOUNTER — Encounter (HOSPITAL_COMMUNITY): Payer: Medicare Other | Attending: Oncology | Admitting: Oncology

## 2014-10-06 VITALS — BP 111/64 | HR 72 | Temp 97.9°F | Resp 18 | Wt 115.5 lb

## 2014-10-06 DIAGNOSIS — Z720 Tobacco use: Secondary | ICD-10-CM

## 2014-10-06 DIAGNOSIS — D0511 Intraductal carcinoma in situ of right breast: Secondary | ICD-10-CM | POA: Diagnosis not present

## 2014-10-06 NOTE — Patient Instructions (Signed)
[facility] Discharge Instructions  RECOMMENDATIONS MADE BY THE CONSULTANT AND ANY TEST RESULTS WILL BE SENT TO YOUR REFERRING PHYSICIAN.  Continue follow-up with primary care physician. Your mammogram results followed below and were negative in January 2015. He'll be due for your next mammogram next month, January 2016. Continue follow-up with Dr. Willey Blade as directed. Return no [facility] for follow-up in 12 months time.  DIGITAL SCREENING UNILATERAL LEFT MAMMOGRAM WITH CAD COMPARISON: Previous exam(s). ACR Breast Density Category b: There are scattered areas of fibroglandular density. FINDINGS: There are no findings suspicious for malignancy. Images were processed with CAD. IMPRESSION: No mammographic evidence of malignancy. A result letter of this screening mammogram will be mailed directly to the patient. RECOMMENDATION: Screening mammogram in one year. (Code:SM-B-01Y) BI-RADS CATEGORY 1: Negative.   Thank you for choosing [place] to provide your oncology and hematology care.  To afford each patient quality time with our providers, please arrive at least 15 minutes before your scheduled appointment time.  With your help, our goal is to use those 15 minutes to complete the necessary work-up to ensure our physicians have the information they need to help with your evaluation and healthcare recommendations.    Effective January 1st, 2014, we ask that you re-schedule your appointment with our physicians should you arrive 10 or more minutes late for your appointment.  We strive to give you quality time with our providers, and arriving late affects you and other patients whose appointments are after yours.    Again, thank you for choosing [facility].  Our hope is that these requests will decrease the amount of time that you wait before being seen by our physicians.        _____________________________________________________________  Should you have questions after your visit to [facility], please contact our office at [phone] between the hours of 8:30 a.m. and 5:00 p.m.  Voicemails left after 4:30 p.m. will not be returned until the following business day.  For prescription refill requests, have your pharmacy contact our office with your prescription refill request.

## 2014-10-27 ENCOUNTER — Telehealth: Payer: Self-pay | Admitting: Nutrition

## 2014-10-27 NOTE — Telephone Encounter (Signed)
Patient was identified to be at risk for malnutrition on the MST secondary to poor appetite and weight loss. Called and spoke with patient's daughter, Ariana Carney, who states she is power of attorney. Patient has had inadequate oral intake and grazes most of the day. Patient has lost 10 pounds over the past year. Educated patient's daughter on providing high-calorie, high-protein meals and snacks. Recommended patient try oral nutrition supplements between meals. Patient only receive $16 per month on food stamps and cannot afford supplements. Will provide one complementary case of Ensure Plus for patient's daughter to pick up from [facility].   Will mail fact sheets on poor appetite and increasing calories and protein along with coupons to daughter's address. Questions were answered.  Teach back method used.  **Disclaimer: This note was dictated with voice recognition software. Similar sounding words can inadvertently be transcribed and this note may contain transcription errors which may not have been corrected upon publication of note.**

## 2014-10-28 DIAGNOSIS — I4891 Unspecified atrial fibrillation: Secondary | ICD-10-CM | POA: Diagnosis not present

## 2014-10-28 DIAGNOSIS — G9009 Other idiopathic peripheral autonomic neuropathy: Secondary | ICD-10-CM | POA: Diagnosis not present

## 2014-10-28 DIAGNOSIS — I10 Essential (primary) hypertension: Secondary | ICD-10-CM | POA: Diagnosis not present

## 2014-11-10 DIAGNOSIS — E538 Deficiency of other specified B group vitamins: Secondary | ICD-10-CM | POA: Diagnosis not present

## 2014-12-21 DIAGNOSIS — E538 Deficiency of other specified B group vitamins: Secondary | ICD-10-CM | POA: Diagnosis not present

## 2015-01-31 ENCOUNTER — Other Ambulatory Visit (HOSPITAL_COMMUNITY): Payer: Self-pay | Admitting: Internal Medicine

## 2015-01-31 DIAGNOSIS — Z1231 Encounter for screening mammogram for malignant neoplasm of breast: Secondary | ICD-10-CM

## 2015-02-01 DIAGNOSIS — R634 Abnormal weight loss: Secondary | ICD-10-CM | POA: Diagnosis not present

## 2015-02-01 DIAGNOSIS — I10 Essential (primary) hypertension: Secondary | ICD-10-CM | POA: Diagnosis not present

## 2015-02-07 DIAGNOSIS — E538 Deficiency of other specified B group vitamins: Secondary | ICD-10-CM | POA: Diagnosis not present

## 2015-03-07 ENCOUNTER — Ambulatory Visit (HOSPITAL_COMMUNITY)
Admission: RE | Admit: 2015-03-07 | Discharge: 2015-03-07 | Disposition: A | Discharge status: Home / Self Care | Payer: Medicare Other | Source: Ambulatory Visit | Attending: Internal Medicine | Admitting: Internal Medicine

## 2015-03-07 DIAGNOSIS — Z1231 Encounter for screening mammogram for malignant neoplasm of breast: Secondary | ICD-10-CM | POA: Insufficient documentation

## 2015-04-07 DIAGNOSIS — E538 Deficiency of other specified B group vitamins: Secondary | ICD-10-CM | POA: Diagnosis not present

## 2015-05-10 DIAGNOSIS — D519 Vitamin B12 deficiency anemia, unspecified: Secondary | ICD-10-CM | POA: Diagnosis not present

## 2015-05-10 DIAGNOSIS — I2510 Atherosclerotic heart disease of native coronary artery without angina pectoris: Secondary | ICD-10-CM | POA: Diagnosis not present

## 2015-05-10 DIAGNOSIS — E538 Deficiency of other specified B group vitamins: Secondary | ICD-10-CM | POA: Diagnosis not present

## 2015-05-10 DIAGNOSIS — I10 Essential (primary) hypertension: Secondary | ICD-10-CM | POA: Diagnosis not present

## 2015-05-10 DIAGNOSIS — E039 Hypothyroidism, unspecified: Secondary | ICD-10-CM | POA: Diagnosis not present

## 2015-05-10 DIAGNOSIS — Z79899 Other long term (current) drug therapy: Secondary | ICD-10-CM | POA: Diagnosis not present

## 2015-05-17 DIAGNOSIS — N183 Chronic kidney disease, stage 3 (moderate): Secondary | ICD-10-CM | POA: Diagnosis not present

## 2015-05-17 DIAGNOSIS — I4891 Unspecified atrial fibrillation: Secondary | ICD-10-CM | POA: Diagnosis not present

## 2015-05-17 DIAGNOSIS — Z6820 Body mass index (BMI) 20.0-20.9, adult: Secondary | ICD-10-CM | POA: Diagnosis not present

## 2015-05-17 DIAGNOSIS — I10 Essential (primary) hypertension: Secondary | ICD-10-CM | POA: Diagnosis not present

## 2015-06-21 DIAGNOSIS — E538 Deficiency of other specified B group vitamins: Secondary | ICD-10-CM | POA: Diagnosis not present

## 2015-08-04 DIAGNOSIS — E538 Deficiency of other specified B group vitamins: Secondary | ICD-10-CM | POA: Diagnosis not present

## 2015-08-04 DIAGNOSIS — Z23 Encounter for immunization: Secondary | ICD-10-CM | POA: Diagnosis not present

## 2015-08-18 DIAGNOSIS — I4891 Unspecified atrial fibrillation: Secondary | ICD-10-CM | POA: Diagnosis not present

## 2015-08-18 DIAGNOSIS — I10 Essential (primary) hypertension: Secondary | ICD-10-CM | POA: Diagnosis not present

## 2015-08-18 DIAGNOSIS — Z23 Encounter for immunization: Secondary | ICD-10-CM | POA: Diagnosis not present

## 2015-08-18 DIAGNOSIS — Z6821 Body mass index (BMI) 21.0-21.9, adult: Secondary | ICD-10-CM | POA: Diagnosis not present

## 2015-08-18 DIAGNOSIS — G9009 Other idiopathic peripheral autonomic neuropathy: Secondary | ICD-10-CM | POA: Diagnosis not present

## 2015-09-23 DIAGNOSIS — E538 Deficiency of other specified B group vitamins: Secondary | ICD-10-CM | POA: Diagnosis not present

## 2015-10-06 ENCOUNTER — Ambulatory Visit (HOSPITAL_COMMUNITY): Payer: Medicare Other | Admitting: Oncology

## 2015-10-06 NOTE — Progress Notes (Signed)
No SHOW

## 2015-10-13 ENCOUNTER — Encounter (HOSPITAL_COMMUNITY): Payer: Self-pay

## 2015-11-03 DIAGNOSIS — E538 Deficiency of other specified B group vitamins: Secondary | ICD-10-CM | POA: Diagnosis not present

## 2015-12-01 DIAGNOSIS — I480 Paroxysmal atrial fibrillation: Secondary | ICD-10-CM | POA: Diagnosis not present

## 2015-12-01 DIAGNOSIS — Z6822 Body mass index (BMI) 22.0-22.9, adult: Secondary | ICD-10-CM | POA: Diagnosis not present

## 2015-12-01 DIAGNOSIS — I10 Essential (primary) hypertension: Secondary | ICD-10-CM | POA: Diagnosis not present

## 2016-02-14 DIAGNOSIS — Z961 Presence of intraocular lens: Secondary | ICD-10-CM | POA: Diagnosis not present

## 2016-02-14 DIAGNOSIS — H04123 Dry eye syndrome of bilateral lacrimal glands: Secondary | ICD-10-CM | POA: Diagnosis not present

## 2016-05-25 DIAGNOSIS — C50919 Malignant neoplasm of unspecified site of unspecified female breast: Secondary | ICD-10-CM | POA: Diagnosis not present

## 2016-05-25 DIAGNOSIS — G589 Mononeuropathy, unspecified: Secondary | ICD-10-CM | POA: Diagnosis not present

## 2016-05-25 DIAGNOSIS — I10 Essential (primary) hypertension: Secondary | ICD-10-CM | POA: Diagnosis not present

## 2016-05-25 DIAGNOSIS — N3281 Overactive bladder: Secondary | ICD-10-CM | POA: Diagnosis not present

## 2016-05-25 DIAGNOSIS — E039 Hypothyroidism, unspecified: Secondary | ICD-10-CM | POA: Diagnosis not present

## 2016-05-25 DIAGNOSIS — Z79899 Other long term (current) drug therapy: Secondary | ICD-10-CM | POA: Diagnosis not present

## 2016-05-25 DIAGNOSIS — E538 Deficiency of other specified B group vitamins: Secondary | ICD-10-CM | POA: Diagnosis not present

## 2016-05-31 DIAGNOSIS — E538 Deficiency of other specified B group vitamins: Secondary | ICD-10-CM | POA: Diagnosis not present

## 2016-05-31 DIAGNOSIS — I10 Essential (primary) hypertension: Secondary | ICD-10-CM | POA: Diagnosis not present

## 2016-05-31 DIAGNOSIS — E039 Hypothyroidism, unspecified: Secondary | ICD-10-CM | POA: Diagnosis not present

## 2016-08-27 ENCOUNTER — Other Ambulatory Visit: Payer: Self-pay | Admitting: Internal Medicine

## 2016-08-27 DIAGNOSIS — Z1231 Encounter for screening mammogram for malignant neoplasm of breast: Secondary | ICD-10-CM

## 2016-09-25 ENCOUNTER — Ambulatory Visit
Admission: RE | Admit: 2016-09-25 | Discharge: 2016-09-25 | Disposition: A | Discharge status: Home / Self Care | Payer: Medicare Other | Source: Ambulatory Visit | Attending: Internal Medicine | Admitting: Internal Medicine

## 2016-09-25 DIAGNOSIS — Z1231 Encounter for screening mammogram for malignant neoplasm of breast: Secondary | ICD-10-CM

## 2016-12-06 DIAGNOSIS — E039 Hypothyroidism, unspecified: Secondary | ICD-10-CM | POA: Diagnosis not present

## 2016-12-06 DIAGNOSIS — E538 Deficiency of other specified B group vitamins: Secondary | ICD-10-CM | POA: Diagnosis not present

## 2016-12-06 DIAGNOSIS — I10 Essential (primary) hypertension: Secondary | ICD-10-CM | POA: Diagnosis not present

## 2017-06-04 DIAGNOSIS — E039 Hypothyroidism, unspecified: Secondary | ICD-10-CM | POA: Diagnosis not present

## 2017-06-04 DIAGNOSIS — C50919 Malignant neoplasm of unspecified site of unspecified female breast: Secondary | ICD-10-CM | POA: Diagnosis not present

## 2017-06-04 DIAGNOSIS — N3281 Overactive bladder: Secondary | ICD-10-CM | POA: Diagnosis not present

## 2017-06-04 DIAGNOSIS — D510 Vitamin B12 deficiency anemia due to intrinsic factor deficiency: Secondary | ICD-10-CM | POA: Diagnosis not present

## 2017-06-04 DIAGNOSIS — I480 Paroxysmal atrial fibrillation: Secondary | ICD-10-CM | POA: Diagnosis not present

## 2017-06-04 DIAGNOSIS — Z79899 Other long term (current) drug therapy: Secondary | ICD-10-CM | POA: Diagnosis not present

## 2017-06-04 DIAGNOSIS — I10 Essential (primary) hypertension: Secondary | ICD-10-CM | POA: Diagnosis not present

## 2017-06-13 DIAGNOSIS — I10 Essential (primary) hypertension: Secondary | ICD-10-CM | POA: Diagnosis not present

## 2017-06-13 DIAGNOSIS — E039 Hypothyroidism, unspecified: Secondary | ICD-10-CM | POA: Diagnosis not present

## 2017-06-13 DIAGNOSIS — Z23 Encounter for immunization: Secondary | ICD-10-CM | POA: Diagnosis not present

## 2017-06-13 DIAGNOSIS — N183 Chronic kidney disease, stage 3 (moderate): Secondary | ICD-10-CM | POA: Diagnosis not present

## 2017-06-13 DIAGNOSIS — I480 Paroxysmal atrial fibrillation: Secondary | ICD-10-CM | POA: Diagnosis not present

## 2017-09-25 ENCOUNTER — Other Ambulatory Visit: Payer: Self-pay | Admitting: Internal Medicine

## 2017-09-25 DIAGNOSIS — Z1231 Encounter for screening mammogram for malignant neoplasm of breast: Secondary | ICD-10-CM

## 2017-10-25 ENCOUNTER — Ambulatory Visit
Admission: RE | Admit: 2017-10-25 | Discharge: 2017-10-25 | Disposition: A | Discharge status: Home / Self Care | Payer: Medicare Other | Source: Ambulatory Visit | Attending: Internal Medicine | Admitting: Internal Medicine

## 2017-10-25 DIAGNOSIS — Z1231 Encounter for screening mammogram for malignant neoplasm of breast: Secondary | ICD-10-CM

## 2017-12-12 DIAGNOSIS — Z6820 Body mass index (BMI) 20.0-20.9, adult: Secondary | ICD-10-CM | POA: Diagnosis not present

## 2017-12-12 DIAGNOSIS — R05 Cough: Secondary | ICD-10-CM | POA: Diagnosis not present

## 2017-12-12 DIAGNOSIS — E039 Hypothyroidism, unspecified: Secondary | ICD-10-CM | POA: Diagnosis not present

## 2017-12-12 DIAGNOSIS — I10 Essential (primary) hypertension: Secondary | ICD-10-CM | POA: Diagnosis not present

## 2018-04-29 DIAGNOSIS — M5030 Other cervical disc degeneration, unspecified cervical region: Secondary | ICD-10-CM | POA: Diagnosis not present

## 2018-06-05 DIAGNOSIS — E538 Deficiency of other specified B group vitamins: Secondary | ICD-10-CM | POA: Diagnosis not present

## 2018-06-05 DIAGNOSIS — C50919 Malignant neoplasm of unspecified site of unspecified female breast: Secondary | ICD-10-CM | POA: Diagnosis not present

## 2018-06-05 DIAGNOSIS — Z79899 Other long term (current) drug therapy: Secondary | ICD-10-CM | POA: Diagnosis not present

## 2018-06-05 DIAGNOSIS — I10 Essential (primary) hypertension: Secondary | ICD-10-CM | POA: Diagnosis not present

## 2018-06-05 DIAGNOSIS — E039 Hypothyroidism, unspecified: Secondary | ICD-10-CM | POA: Diagnosis not present

## 2018-06-12 DIAGNOSIS — N183 Chronic kidney disease, stage 3 (moderate): Secondary | ICD-10-CM | POA: Diagnosis not present

## 2018-06-12 DIAGNOSIS — I10 Essential (primary) hypertension: Secondary | ICD-10-CM | POA: Diagnosis not present

## 2018-06-12 DIAGNOSIS — E039 Hypothyroidism, unspecified: Secondary | ICD-10-CM | POA: Diagnosis not present

## 2018-07-04 ENCOUNTER — Ambulatory Visit (HOSPITAL_COMMUNITY)
Admission: RE | Admit: 2018-07-04 | Discharge: 2018-07-04 | Disposition: A | Discharge status: Home / Self Care | Payer: Medicare Other | Source: Ambulatory Visit | Attending: Internal Medicine | Admitting: Internal Medicine

## 2018-07-04 ENCOUNTER — Other Ambulatory Visit (HOSPITAL_COMMUNITY): Payer: Self-pay | Admitting: Internal Medicine

## 2018-07-04 DIAGNOSIS — M542 Cervicalgia: Secondary | ICD-10-CM | POA: Diagnosis not present

## 2018-07-15 DIAGNOSIS — Z23 Encounter for immunization: Secondary | ICD-10-CM | POA: Diagnosis not present

## 2018-07-15 DIAGNOSIS — M5030 Other cervical disc degeneration, unspecified cervical region: Secondary | ICD-10-CM | POA: Diagnosis not present

## 2018-10-20 ENCOUNTER — Other Ambulatory Visit: Payer: Self-pay | Admitting: Internal Medicine

## 2018-10-20 DIAGNOSIS — Z1231 Encounter for screening mammogram for malignant neoplasm of breast: Secondary | ICD-10-CM

## 2018-11-04 DIAGNOSIS — M5090 Cervical disc disorder, unspecified, unspecified cervical region: Secondary | ICD-10-CM | POA: Diagnosis not present

## 2018-11-20 ENCOUNTER — Other Ambulatory Visit: Payer: Self-pay | Admitting: Internal Medicine

## 2018-11-20 ENCOUNTER — Ambulatory Visit
Admission: RE | Admit: 2018-11-20 | Discharge: 2018-11-20 | Disposition: A | Discharge status: Home / Self Care | Payer: Medicare Other | Source: Ambulatory Visit | Attending: Internal Medicine | Admitting: Internal Medicine

## 2018-11-20 DIAGNOSIS — Z1231 Encounter for screening mammogram for malignant neoplasm of breast: Secondary | ICD-10-CM | POA: Diagnosis not present

## 2019-03-05 ENCOUNTER — Ambulatory Visit (INDEPENDENT_AMBULATORY_CARE_PROVIDER_SITE_OTHER): Payer: Medicare Other | Admitting: Podiatry

## 2019-03-05 ENCOUNTER — Other Ambulatory Visit: Payer: Self-pay

## 2019-03-05 VITALS — Temp 98.6°F

## 2019-03-05 DIAGNOSIS — G629 Polyneuropathy, unspecified: Secondary | ICD-10-CM

## 2019-03-05 DIAGNOSIS — Q828 Other specified congenital malformations of skin: Secondary | ICD-10-CM

## 2019-03-05 DIAGNOSIS — M79675 Pain in left toe(s): Secondary | ICD-10-CM | POA: Diagnosis not present

## 2019-03-05 DIAGNOSIS — M79674 Pain in right toe(s): Secondary | ICD-10-CM

## 2019-03-05 DIAGNOSIS — B351 Tinea unguium: Secondary | ICD-10-CM

## 2019-03-06 NOTE — Progress Notes (Signed)
Subjective:   Patient ID: Ariana Carney, female   DOB: 85 y.o.   MRN: [id number]   HPI 85 year old female presents the office today with her daughter for concerns of thick, painful, elongated toenails that she cannot trim her self.  She also has a callus on the right foot.  She has been recently diagnosed with neuropathy and she is on Lyrica.  Denies any open sores.  She has no other concerns today.  Review of Systems  All other systems reviewed and are negative.  Past Medical History:  Diagnosis Date  . Breast cancer ([place]) 2007   right  . 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 Heart disease   . Hypertension   . Thyroid disease   . Ulcer     Past Surgical History:  Procedure Laterality Date  . ABDOMINAL HYSTERECTOMY    . CHOLECYSTECTOMY    . MASTECTOMY Left 10+ years ago     Current Outpatient Medications:  .  cyanocobalamin (,VITAMIN B-12,) 1000 MCG/ML injection, Inject 1,000 mcg into the muscle every 30 (thirty) days., Disp: , Rfl:  .  digoxin (LANOXIN) 0.125 MG tablet, Take 125 mcg by mouth daily.  , Disp: , Rfl:  .  famotidine (PEPCID) 10 MG tablet, Take 40 mg by mouth daily. , Disp: , Rfl:  .  levothyroxine (SYNTHROID, LEVOTHROID) 50 MCG tablet, Take 50 mcg by mouth daily.  , Disp: , Rfl:  .  lisinopril-hydrochlorothiazide (PRINZIDE,ZESTORETIC) 10-12.5 MG per tablet, Take 1 tablet by mouth daily., Disp: , Rfl:  .  mirtazapine (REMERON) 15 MG tablet, Take 15 mg by mouth at bedtime.  , Disp: , Rfl:  .  NONFORMULARY OR COMPOUNDED ITEM, Apply 1 g topically daily. Ketam./Bacl./gaba/amit/cloni/bupiv Apply to feet, Disp: , Rfl:  .  oxybutynin (DITROPAN-XL) 5 MG 24 hr tablet, Take 5 mg by mouth daily., Disp: , Rfl:  .  pregabalin (LYRICA) 50 MG capsule, Take 50 mg by mouth 3 (three) times daily., Disp: , Rfl:   No Known Allergies       Objective:  Physical Exam  General: NAD  Dermatological: Nails are hypertrophic, dystrophic, brittle,  discolored, elongated 10. No surrounding redness or drainage. Tenderness nails 1-5 bilaterally.  Thick hyperkeratotic lesion right foot submetatarsal 5.  Upon debridement no underlying ulceration, drainage or any clinical signs of infection is noted.  No open lesions or pre-ulcerative lesions are identified today.  Vascular: Dorsalis Pedis artery and Posterior Tibial artery pedal pulses are 2/4 bilateral with immedate capillary fill time.  There is no pain with calf compression, swelling, warmth, erythema.   Neruologic: Sensation decreased with Semmes-Weinstein monofilament  Musculoskeletal: No gross boney pedal deformities bilateral. No pain, crepitus, or limitation noted with foot and ankle range of motion bilateral. Muscular strength 5/5 in all groups tested bilateral.  Gait: Unassisted, Nonantalgic.       Assessment:   85 year old female with symptomatic hyperkeratotic lesion, symptomatic onychomycosis; neuropathy    Plan:  -Treatment options discussed including all alternatives, risks, and complications -Etiology of symptoms were discussed -Nails debrided 10 without complications or bleeding. -Hyperkeratotic lesion sharply debrided x1 without any complications or bleeding -Continue Lyrica for neuropathy. -Daily foot inspection -Follow-up in 3 months or sooner if any problems arise. In the meantime, encouraged to call the office with any questions, concerns, change in symptoms.   Celesta Gentile, DPM

## 2019-05-11 DIAGNOSIS — D519 Vitamin B12 deficiency anemia, unspecified: Secondary | ICD-10-CM | POA: Diagnosis not present

## 2019-05-11 DIAGNOSIS — R609 Edema, unspecified: Secondary | ICD-10-CM | POA: Diagnosis not present

## 2019-05-11 DIAGNOSIS — E46 Unspecified protein-calorie malnutrition: Secondary | ICD-10-CM | POA: Diagnosis not present

## 2019-05-11 DIAGNOSIS — E441 Mild protein-calorie malnutrition: Secondary | ICD-10-CM | POA: Diagnosis not present

## 2019-05-11 DIAGNOSIS — Z79899 Other long term (current) drug therapy: Secondary | ICD-10-CM | POA: Diagnosis not present

## 2019-06-04 ENCOUNTER — Ambulatory Visit: Payer: Medicare Other | Admitting: Podiatry

## 2019-06-09 ENCOUNTER — Encounter: Payer: Self-pay | Admitting: Podiatry

## 2019-06-09 ENCOUNTER — Other Ambulatory Visit: Payer: Self-pay

## 2019-06-09 ENCOUNTER — Ambulatory Visit (INDEPENDENT_AMBULATORY_CARE_PROVIDER_SITE_OTHER): Payer: Medicare Other | Admitting: Podiatry

## 2019-06-09 VITALS — Temp 98.1°F

## 2019-06-09 DIAGNOSIS — G629 Polyneuropathy, unspecified: Secondary | ICD-10-CM

## 2019-06-09 DIAGNOSIS — B351 Tinea unguium: Secondary | ICD-10-CM | POA: Diagnosis not present

## 2019-06-09 DIAGNOSIS — M79674 Pain in right toe(s): Secondary | ICD-10-CM | POA: Diagnosis not present

## 2019-06-09 DIAGNOSIS — M79675 Pain in left toe(s): Secondary | ICD-10-CM | POA: Diagnosis not present

## 2019-06-09 NOTE — Progress Notes (Signed)
Subjective: 85 y.o. returns the office today for painful, elongated, thickened toenails which she cannot trim herself. Denies any redness or drainage around the nails. Denies any acute changes since last appointment and no new complaints today. Denies any systemic complaints such as fevers, chills, nausea, vomiting.   PCP: [name], MD   Objective: NAD- presents with daughter  DP/PT pulses palpable, CRT less than 3 seconds Nails hypertrophic, dystrophic, elongated, brittle, discolored 10. There is tenderness overlying the nails 1-5 bilaterally. There is no surrounding erythema or drainage along the nail sites. No open lesions or pre-ulcerative lesions are identified. No other areas of tenderness bilateral lower extremities. No overlying edema, erythema, increased warmth. No pain with calf compression, swelling, warmth, erythema.  Assessment: Patient presents with symptomatic onychomycosis  Plan: -Treatment options including alternatives, risks, complications were discussed -Nails sharply debrided 10 without complication/bleeding. -Discussed daily foot inspection. If there are any changes, to call the office immediately.  -Follow-up in 3 months or sooner if any problems are to arise. In the meantime, encouraged to call the office with any questions, concerns, changes symptoms.  Celesta Gentile, DPM

## 2019-06-26 DIAGNOSIS — I10 Essential (primary) hypertension: Secondary | ICD-10-CM | POA: Diagnosis not present

## 2019-06-26 DIAGNOSIS — R609 Edema, unspecified: Secondary | ICD-10-CM | POA: Diagnosis not present

## 2019-07-01 ENCOUNTER — Ambulatory Visit (INDEPENDENT_AMBULATORY_CARE_PROVIDER_SITE_OTHER): Payer: Medicare Other | Admitting: Orthopedic Surgery

## 2019-07-01 ENCOUNTER — Other Ambulatory Visit: Payer: Self-pay

## 2019-07-01 VITALS — BP 136/58 | HR 61 | Temp 97.8°F | Wt 100.0 lb

## 2019-07-01 DIAGNOSIS — R221 Localized swelling, mass and lump, neck: Secondary | ICD-10-CM

## 2019-07-01 NOTE — Progress Notes (Signed)
Ariana Carney  07/01/2019  HISTORY SECTION :  Chief Complaint  Patient presents with  . Neck Pain    Neck pain.   The patient presents for evaluation of painful cervical spine.  On the right side of the cervical spine just in front of the lateral musculature there is a firm mass in the soft tissues which is tender  Location right paracervical musculature Duration present for several months pain located in the same area Quality dull ache Severity mild to moderate Associated with no numbness or tingling    Review of Systems  Constitutional: Negative for weight loss.  Gastrointestinal:       Loss of appetite  Neurological: Negative for tingling, tremors and sensory change.     Past Medical History:  Diagnosis Date  . Breast cancer ([place]) 2007   right  . 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 Heart disease   . Hypertension   . Thyroid disease   . Ulcer     Past Surgical History:  Procedure Laterality Date  . ABDOMINAL HYSTERECTOMY    . CHOLECYSTECTOMY    . MASTECTOMY Left 10+ years ago     No Known Allergies   Current Outpatient Medications:  .  cyanocobalamin (,VITAMIN B-12,) 1000 MCG/ML injection, Inject 1,000 mcg into the muscle every 30 (thirty) days., Disp: , Rfl:  .  digoxin (LANOXIN) 0.125 MG tablet, Take 125 mcg by mouth daily.  , Disp: , Rfl:  .  famotidine (PEPCID) 10 MG tablet, Take 40 mg by mouth daily. , Disp: , Rfl:  .  levothyroxine (SYNTHROID, LEVOTHROID) 50 MCG tablet, Take 50 mcg by mouth daily.  , Disp: , Rfl:  .  lisinopril-hydrochlorothiazide (PRINZIDE,ZESTORETIC) 10-12.5 MG per tablet, Take 1 tablet by mouth daily., Disp: , Rfl:  .  mirtazapine (REMERON) 15 MG tablet, Take 15 mg by mouth at bedtime.  , Disp: , Rfl:  .  oxybutynin (DITROPAN-XL) 5 MG 24 hr tablet, Take 5 mg by mouth daily., Disp: , Rfl:  .  NONFORMULARY OR COMPOUNDED ITEM, Apply 1 g topically daily. Ketam./Bacl./gaba/amit/cloni/bupiv Apply to feet,  Disp: , Rfl:  .  pregabalin (LYRICA) 50 MG capsule, Take 50 mg by mouth 3 (three) times daily., Disp: , Rfl:    PHYSICAL EXAM SECTION: 1) BP (!) 136/58   Pulse 61   Temp 97.8 F (36.6 C)   Wt 100 lb (45.4 kg)   BMI 18.29 kg/m   Body mass index is 18.29 kg/m. General appearance: Well-developed well-nourished no gross deformities  2) Cardiovascular normal pulse and perfusion in the upper extremities normal color without edema  3) Neurologically deep tendon reflexes are equal and normal, no sensation loss or deficits no pathologic reflexes  4) Psychological: Awake alert and oriented x3 mood and affect normal  5) Skin no lacerations or ulcerations no nodularity no palpable masses, no erythema or nodularity  6) Musculoskeletal: Cervical spine right paracervical musculature there is a 4 x 3 cm firm soft tissue mass with no midline cervical tenderness mild decreased range of motion most likely related to arthritis no atrophy is noted upper extremity strength is normal in both upper extremities with no atrophy or tremor   MEDICAL DECISION SECTION:  Encounter Diagnosis  Name Primary?  . Mass of right side of neck Yes    Imaging Plain cervical spine x-ray shows osteoarthritis degenerative changes cervical spine does not show the soft tissue mass although there is some evidence of prior surgery  Plan:  (Rx., Inj., surg., Frx, MRI/CT, XR:2)  Recommend referral to ENT does not seem to be orthopedic related  10:49 AM Arther Abbott, MD  07/01/2019

## 2019-07-01 NOTE — Patient Instructions (Signed)
We have sent the referral to Dr Benjamine Mola, you can call to make appointment there.

## 2019-07-27 ENCOUNTER — Ambulatory Visit (INDEPENDENT_AMBULATORY_CARE_PROVIDER_SITE_OTHER): Payer: Medicare Other | Admitting: Otolaryngology

## 2019-07-27 ENCOUNTER — Other Ambulatory Visit (HOSPITAL_COMMUNITY): Payer: Self-pay | Admitting: Otolaryngology

## 2019-07-27 ENCOUNTER — Other Ambulatory Visit: Payer: Self-pay

## 2019-07-27 ENCOUNTER — Other Ambulatory Visit: Payer: Self-pay | Admitting: Otolaryngology

## 2019-07-27 DIAGNOSIS — R221 Localized swelling, mass and lump, neck: Secondary | ICD-10-CM | POA: Diagnosis not present

## 2019-07-27 DIAGNOSIS — R590 Localized enlarged lymph nodes: Secondary | ICD-10-CM | POA: Diagnosis not present

## 2019-08-04 ENCOUNTER — Other Ambulatory Visit: Payer: Self-pay

## 2019-08-04 ENCOUNTER — Ambulatory Visit (HOSPITAL_COMMUNITY)
Admission: RE | Admit: 2019-08-04 | Discharge: 2019-08-04 | Disposition: A | Discharge status: Home / Self Care | Payer: Medicare Other | Source: Ambulatory Visit | Attending: Otolaryngology | Admitting: Otolaryngology

## 2019-08-04 ENCOUNTER — Other Ambulatory Visit (HOSPITAL_COMMUNITY): Payer: Self-pay | Admitting: Family Medicine

## 2019-08-04 DIAGNOSIS — R221 Localized swelling, mass and lump, neck: Secondary | ICD-10-CM

## 2019-08-04 LAB — POCT I-STAT CREATININE: Creatinine, Ser: 1.1 mg/dL — ABNORMAL HIGH (ref 0.44–1.00)

## 2019-08-04 MED ORDER — IOHEXOL 300 MG/ML  SOLN
75.0000 mL | Freq: Once | INTRAMUSCULAR | Status: AC | PRN
  Administered 2019-08-04: 15:00:00 60 mL via INTRAVENOUS

## 2019-08-12 DIAGNOSIS — M542 Cervicalgia: Secondary | ICD-10-CM | POA: Diagnosis not present

## 2019-09-08 ENCOUNTER — Ambulatory Visit (INDEPENDENT_AMBULATORY_CARE_PROVIDER_SITE_OTHER): Payer: Medicare Other | Admitting: Podiatry

## 2019-09-08 ENCOUNTER — Other Ambulatory Visit: Payer: Self-pay

## 2019-09-08 DIAGNOSIS — M79674 Pain in right toe(s): Secondary | ICD-10-CM | POA: Diagnosis not present

## 2019-09-08 DIAGNOSIS — B351 Tinea unguium: Secondary | ICD-10-CM

## 2019-09-08 DIAGNOSIS — G629 Polyneuropathy, unspecified: Secondary | ICD-10-CM | POA: Diagnosis not present

## 2019-09-08 DIAGNOSIS — M79675 Pain in left toe(s): Secondary | ICD-10-CM

## 2019-09-13 NOTE — Progress Notes (Signed)
Subjective: 85 y.o. returns the office today for painful, elongated, thickened toenails which she cannot trim herself. Denies any redness or drainage around the nails.no new concerns.  She is on Lyrica for neuropathy.  Denies any systemic complaints such as fevers, chills, nausea, vomiting.   PCP: [name], MD- last seen 05/11/2019   Objective: NAD- presents with daughter  DP/PT pulses palpable, CRT less than 3 seconds Nails hypertrophic, dystrophic, elongated, brittle, discolored 10. There is tenderness overlying the nails 1-5 bilaterally. There is no surrounding erythema or drainage along the nail sites. No open lesions or pre-ulcerative lesions are identified. No pain with calf compression, swelling, warmth, erythema.  Assessment: Patient presents with symptomatic onychomycosis  Plan: -Treatment options including alternatives, risks, complications were discussed -Nails sharply debrided 10 without complication/bleeding. -Discussed daily foot inspection. If there are any changes, to call the office immediately.  -Follow-up in 3 months or sooner if any problems are to arise. In the meantime, encouraged to call the office with any questions, concerns, changes symptoms.  Celesta Gentile, DPM

## 2019-10-27 ENCOUNTER — Other Ambulatory Visit: Payer: Self-pay | Admitting: Internal Medicine

## 2019-10-27 DIAGNOSIS — Z1231 Encounter for screening mammogram for malignant neoplasm of breast: Secondary | ICD-10-CM

## 2019-11-02 IMAGING — MG DIGITAL SCREENING UNILATERAL LEFT MAMMOGRAM WITH CAD AND TOMO
4 series · 4 of 12 positions shown · non-contrast
Comparison: Previous exam(s).

CLINICAL DATA: Screening.

EXAM:
DIGITAL SCREENING UNILATERAL LEFT MAMMOGRAM WITH CAD AND TOMO

[L MLO synth-2D]
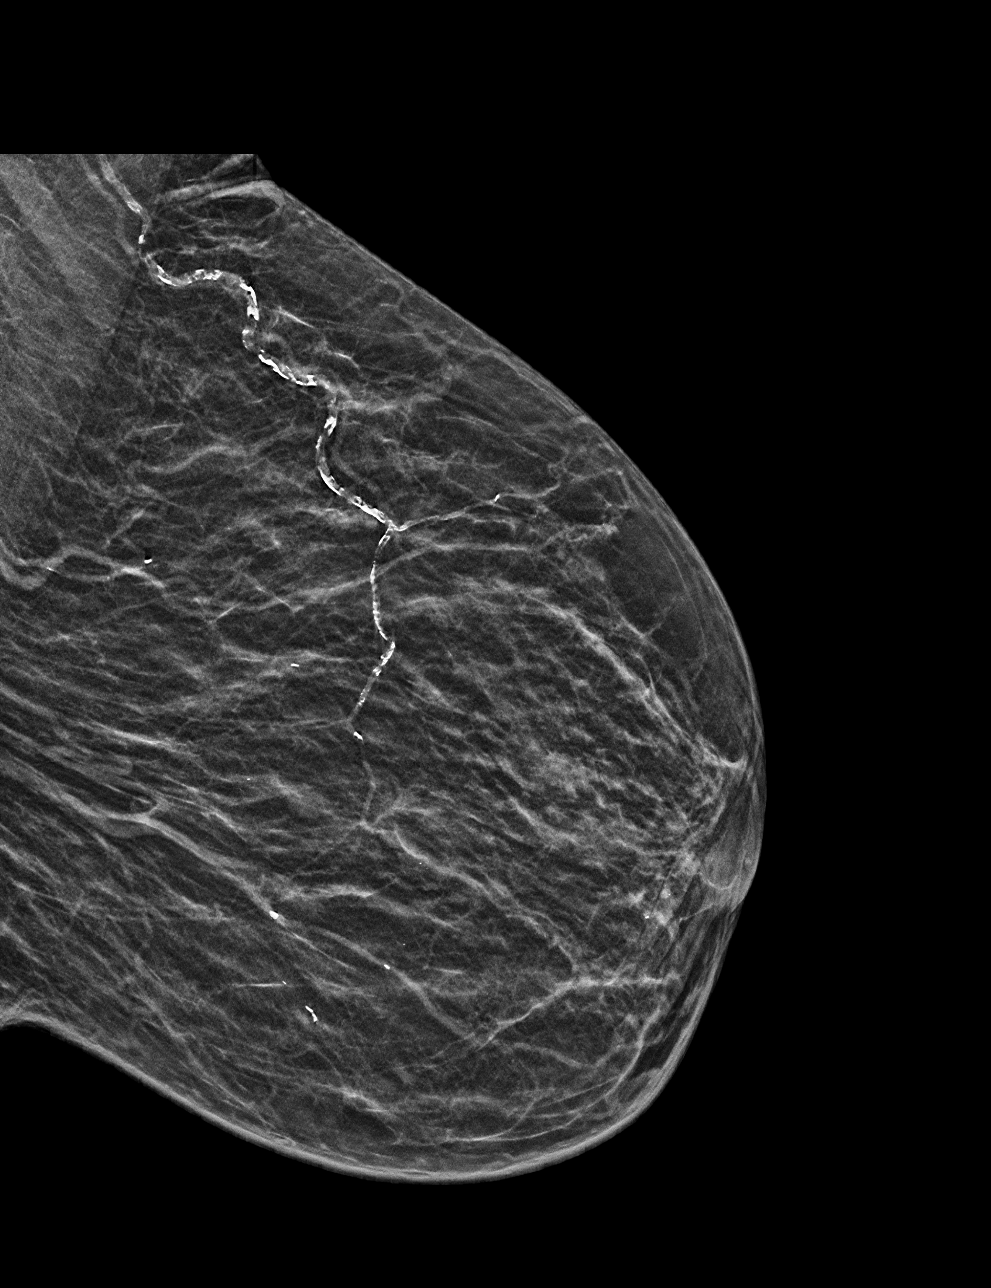

[L CC synth-2D]
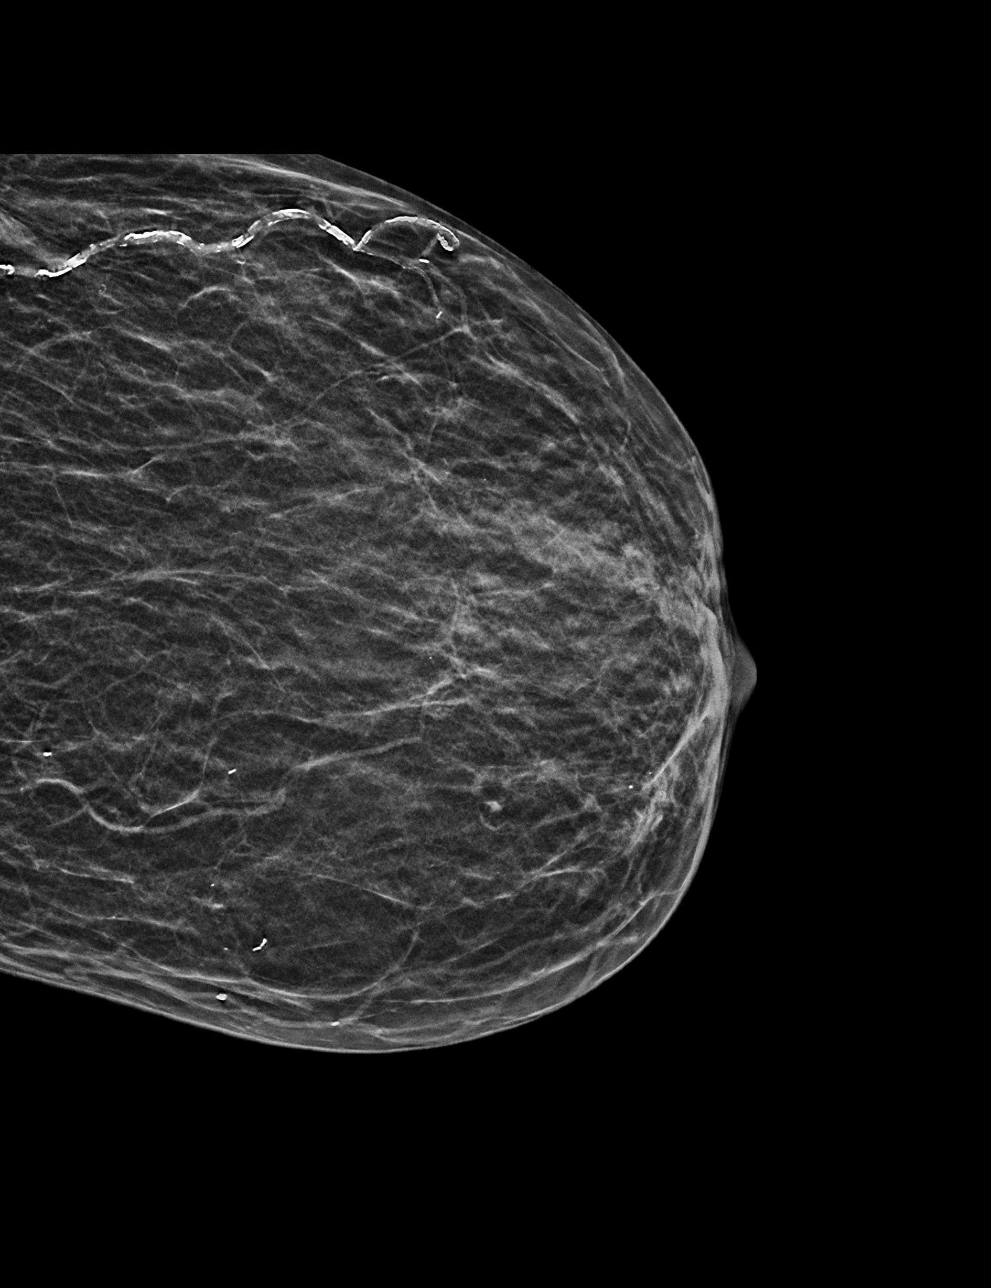

[L MLO tomo · tomo slice 13/26.0]
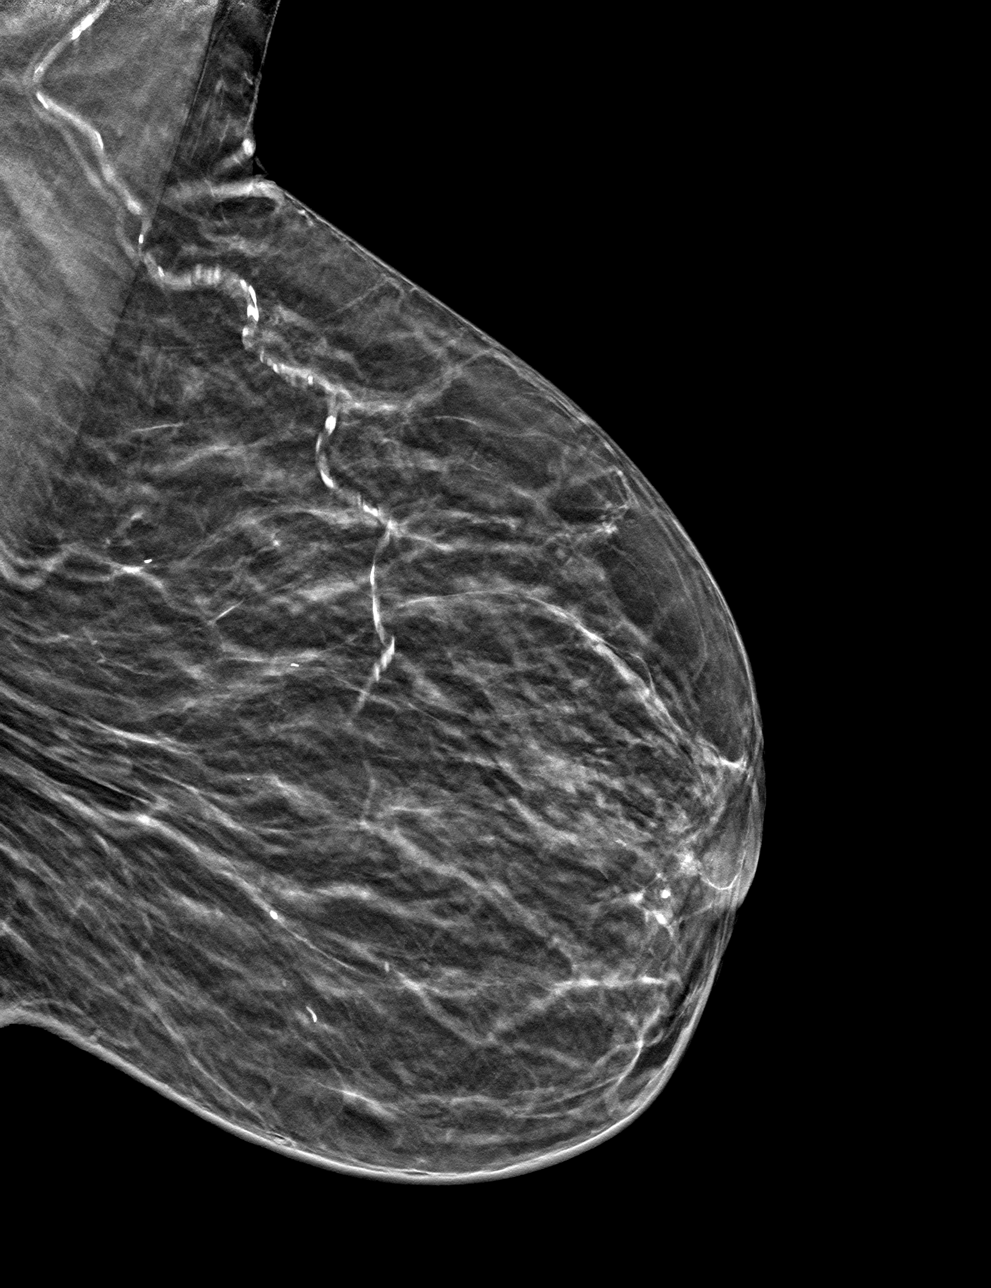

[L CC tomo · tomo slice 13/24.0]
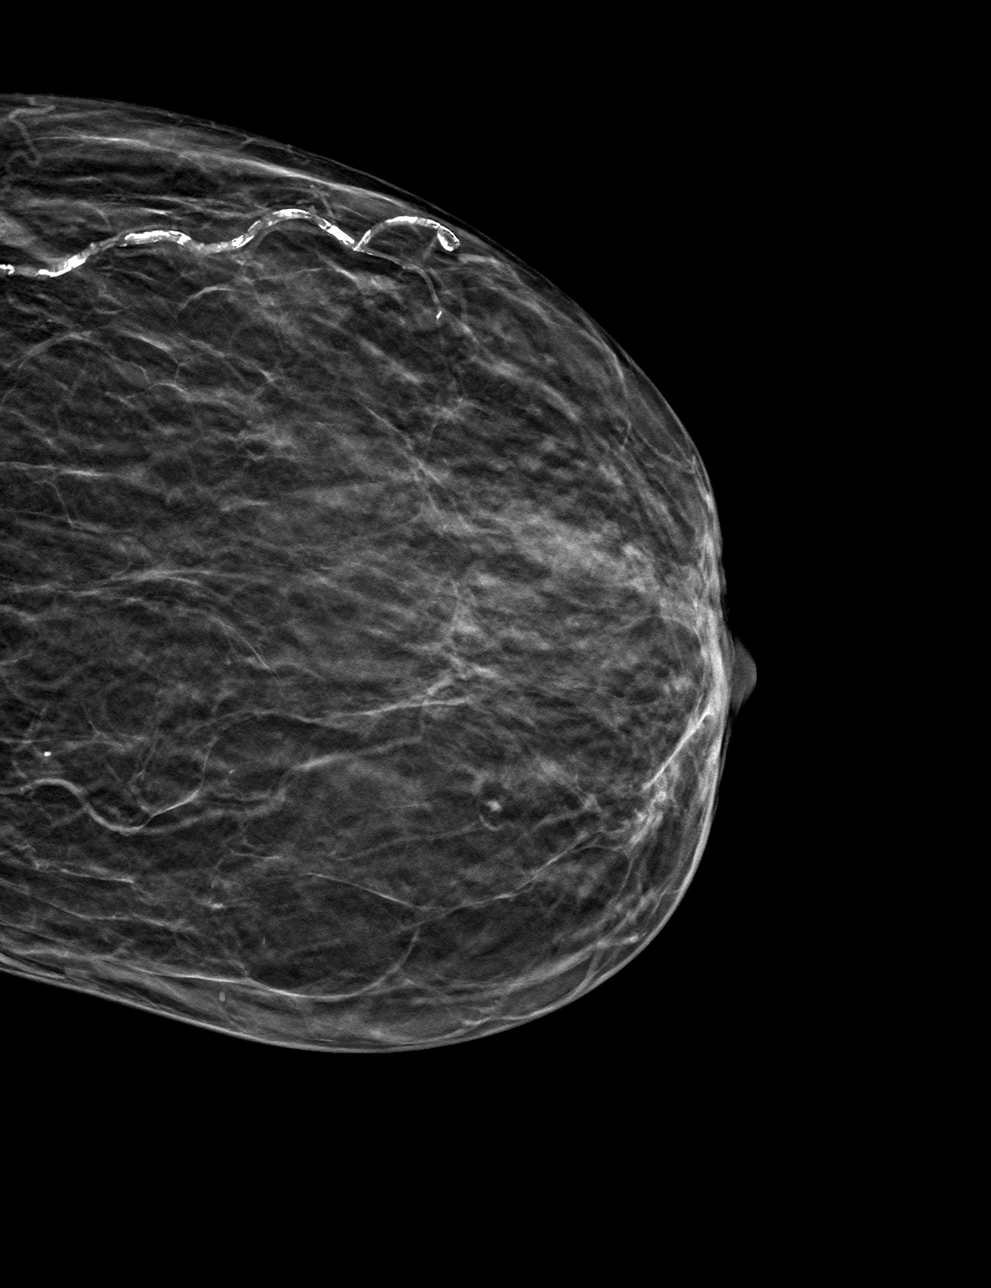

[4 of 12 positions shown; findings below may reference images not displayed]

ACR Breast Density Category b: There are scattered areas of
fibroglandular density.
FINDINGS: There are no findings suspicious for malignancy. Images were
processed with CAD.
IMPRESSION: No mammographic evidence of malignancy. A result letter of this
screening mammogram will be mailed directly to the patient.

RECOMMENDATION:
Screening mammogram in one year. (Code:51-Y-CJA)

BI-RADS CATEGORY  1: Negative.

## 2019-12-05 DIAGNOSIS — Z23 Encounter for immunization: Secondary | ICD-10-CM | POA: Diagnosis not present

## 2019-12-07 ENCOUNTER — Other Ambulatory Visit: Payer: Self-pay

## 2019-12-07 ENCOUNTER — Ambulatory Visit (INDEPENDENT_AMBULATORY_CARE_PROVIDER_SITE_OTHER): Payer: Medicare Other | Admitting: Podiatry

## 2019-12-07 ENCOUNTER — Encounter: Payer: Self-pay | Admitting: Podiatry

## 2019-12-07 ENCOUNTER — Ambulatory Visit
Admission: RE | Admit: 2019-12-07 | Discharge: 2019-12-07 | Disposition: A | Discharge status: Home / Self Care | Payer: Medicare Other | Source: Ambulatory Visit | Attending: Internal Medicine | Admitting: Internal Medicine

## 2019-12-07 DIAGNOSIS — Q828 Other specified congenital malformations of skin: Secondary | ICD-10-CM

## 2019-12-07 DIAGNOSIS — G629 Polyneuropathy, unspecified: Secondary | ICD-10-CM | POA: Diagnosis not present

## 2019-12-07 DIAGNOSIS — Z1231 Encounter for screening mammogram for malignant neoplasm of breast: Secondary | ICD-10-CM | POA: Diagnosis not present

## 2019-12-07 DIAGNOSIS — B351 Tinea unguium: Secondary | ICD-10-CM

## 2019-12-07 DIAGNOSIS — M79674 Pain in right toe(s): Secondary | ICD-10-CM

## 2019-12-07 DIAGNOSIS — M79675 Pain in left toe(s): Secondary | ICD-10-CM | POA: Diagnosis not present

## 2019-12-07 NOTE — Progress Notes (Signed)
Subjective: 86 y.o. returns the office today for painful, elongated, thickened toenails which she cannot trim herself as well as for a callus on the right foot. Denies any redness or drainage around the nails.no new concerns.  Denies any systemic complaints such as fevers, chills, nausea, vomiting.   PCP: [name], MD- last seen 05/11/2019  Objective: NAD- presents with daughter  DP/PT pulses palpable, CRT less than 3 seconds Nails hypertrophic, dystrophic, elongated, brittle, discolored 10. There is tenderness overlying the nails 1-5 bilaterally. There is no surrounding erythema or drainage along the nail sites. Hyperkeratotic lesion right submetatarsal 5 without any underlying ulceration, drainage, signs of infection.  No open lesions or pre-ulcerative lesions are identified. No pain with calf compression, swelling, warmth, erythema.  Assessment: Patient presents with symptomatic onychomycosis; hyperkeratotic lesion  Plan: -Treatment options including alternatives, risks, complications were discussed -Nails sharply debrided 10 without complication/bleeding. -Hyperkeratotic lesion sharply debrided x 1 without any complications or bleeding.  -Discussed daily foot inspection. If there are any changes, to call the office immediately.  -Follow-up in 3 months or sooner if any problems are to arise. In the meantime, encouraged to call the office with any questions, concerns, changes symptoms.  Celesta Gentile, DPM

## 2019-12-17 DIAGNOSIS — E039 Hypothyroidism, unspecified: Secondary | ICD-10-CM | POA: Diagnosis not present

## 2019-12-17 DIAGNOSIS — Z79899 Other long term (current) drug therapy: Secondary | ICD-10-CM | POA: Diagnosis not present

## 2019-12-25 DIAGNOSIS — M1991 Primary osteoarthritis, unspecified site: Secondary | ICD-10-CM | POA: Diagnosis not present

## 2019-12-25 DIAGNOSIS — Z853 Personal history of malignant neoplasm of breast: Secondary | ICD-10-CM | POA: Diagnosis not present

## 2019-12-25 DIAGNOSIS — E039 Hypothyroidism, unspecified: Secondary | ICD-10-CM | POA: Diagnosis not present

## 2020-01-02 DIAGNOSIS — Z23 Encounter for immunization: Secondary | ICD-10-CM | POA: Diagnosis not present

## 2020-03-07 ENCOUNTER — Ambulatory Visit: Payer: Medicare Other | Admitting: Podiatry

## 2020-04-04 ENCOUNTER — Other Ambulatory Visit: Payer: Self-pay

## 2020-04-04 ENCOUNTER — Ambulatory Visit (INDEPENDENT_AMBULATORY_CARE_PROVIDER_SITE_OTHER): Payer: Medicare Other | Admitting: Podiatry

## 2020-04-04 ENCOUNTER — Encounter: Payer: Self-pay | Admitting: Podiatry

## 2020-04-04 DIAGNOSIS — B351 Tinea unguium: Secondary | ICD-10-CM

## 2020-04-04 DIAGNOSIS — M79674 Pain in right toe(s): Secondary | ICD-10-CM | POA: Diagnosis not present

## 2020-04-04 DIAGNOSIS — G629 Polyneuropathy, unspecified: Secondary | ICD-10-CM | POA: Diagnosis not present

## 2020-04-04 DIAGNOSIS — Q828 Other specified congenital malformations of skin: Secondary | ICD-10-CM

## 2020-04-04 DIAGNOSIS — M79675 Pain in left toe(s): Secondary | ICD-10-CM | POA: Diagnosis not present

## 2020-04-05 NOTE — Progress Notes (Signed)
Subjective: 86 y.o. returns the office today for painful, elongated, thickened toenails which she cannot trim herself as well as for a callus on the right foot. Denies any redness or drainage around the nails.no new concerns.  Denies any systemic complaints such as fevers, chills, nausea, vomiting.   PCP: [name], MD  Objective: NAD- presents with daughter  DP/PT pulses palpable, CRT less than 3 seconds Nails hypertrophic, dystrophic, elongated, brittle, discolored 10. There is tenderness overlying the nails 1-5 bilaterally. There is no surrounding erythema or drainage along the nail sites. Hyperkeratotic lesion right submetatarsal 5 without any underlying ulceration, drainage, signs of infection.  No open lesions or pre-ulcerative lesions are identified. No pain with calf compression, swelling, warmth, erythema.  Assessment: Patient presents with symptomatic onychomycosis; hyperkeratotic lesion  Plan: -Treatment options including alternatives, risks, complications were discussed -Nails sharply debrided 10 without complication/bleeding. -Hyperkeratotic lesion sharply debrided x 1 without any complications or bleeding.  -Discussed daily foot inspection. If there are any changes, to call the office immediately.  -Follow-up in 3 months or sooner if any problems are to arise. In the meantime, encouraged to call the office with any questions, concerns, changes symptoms.  Celesta Gentile, DPM

## 2020-06-24 DIAGNOSIS — E538 Deficiency of other specified B group vitamins: Secondary | ICD-10-CM | POA: Diagnosis not present

## 2020-06-24 DIAGNOSIS — I10 Essential (primary) hypertension: Secondary | ICD-10-CM | POA: Diagnosis not present

## 2020-06-24 DIAGNOSIS — M1990 Unspecified osteoarthritis, unspecified site: Secondary | ICD-10-CM | POA: Diagnosis not present

## 2020-06-24 DIAGNOSIS — Z79899 Other long term (current) drug therapy: Secondary | ICD-10-CM | POA: Diagnosis not present

## 2020-06-24 DIAGNOSIS — E039 Hypothyroidism, unspecified: Secondary | ICD-10-CM | POA: Diagnosis not present

## 2020-07-01 DIAGNOSIS — D519 Vitamin B12 deficiency anemia, unspecified: Secondary | ICD-10-CM | POA: Diagnosis not present

## 2020-07-01 DIAGNOSIS — I10 Essential (primary) hypertension: Secondary | ICD-10-CM | POA: Diagnosis not present

## 2020-07-01 DIAGNOSIS — R634 Abnormal weight loss: Secondary | ICD-10-CM | POA: Diagnosis not present

## 2020-07-05 ENCOUNTER — Other Ambulatory Visit: Payer: Self-pay

## 2020-07-05 ENCOUNTER — Ambulatory Visit (INDEPENDENT_AMBULATORY_CARE_PROVIDER_SITE_OTHER): Payer: Medicare Other | Admitting: Podiatry

## 2020-07-05 DIAGNOSIS — B351 Tinea unguium: Secondary | ICD-10-CM | POA: Diagnosis not present

## 2020-07-05 DIAGNOSIS — G629 Polyneuropathy, unspecified: Secondary | ICD-10-CM | POA: Diagnosis not present

## 2020-07-05 DIAGNOSIS — M79674 Pain in right toe(s): Secondary | ICD-10-CM

## 2020-07-05 DIAGNOSIS — M79675 Pain in left toe(s): Secondary | ICD-10-CM | POA: Diagnosis not present

## 2020-07-05 DIAGNOSIS — Q828 Other specified congenital malformations of skin: Secondary | ICD-10-CM | POA: Diagnosis not present

## 2020-07-06 NOTE — Progress Notes (Signed)
Subjective: 86 y.o. returns the office today for painful, elongated, thickened toenails which she cannot trim herself as well as for a callus on the right foot. Denies any redness or drainage around the nails.no new concerns.  Denies any systemic complaints such as fevers, chills, nausea, vomiting.   PCP: [name], MD  Objective: NAD- presents with daughter  DP/PT pulses palpable, CRT less than 3 seconds Nails hypertrophic, dystrophic, elongated, brittle, discolored 10. There is tenderness overlying the nails 1-5 bilaterally. There is no surrounding erythema or drainage along the nail sites. Hyperkeratotic lesion right submetatarsal 5 without any underlying ulceration, drainage, signs of infection.  No open lesions or pre-ulcerative lesions are identified. No pain with calf compression, swelling, warmth, erythema.  Assessment: Patient presents with symptomatic onychomycosis; hyperkeratotic lesion  Plan: -Treatment options including alternatives, risks, complications were discussed -Nails sharply debrided 10 without complication/bleeding. -Hyperkeratotic lesion sharply debrided x 1 without any complications or bleeding.  -Discussed daily foot inspection. If there are any changes, to call the office immediately.  -Follow-up in 3 months or sooner if any problems are to arise. In the meantime, encouraged to call the office with any questions, concerns, changes symptoms.  Celesta Gentile, DPM

## 2020-09-17 DIAGNOSIS — Z23 Encounter for immunization: Secondary | ICD-10-CM | POA: Diagnosis not present

## 2020-10-04 ENCOUNTER — Ambulatory Visit (INDEPENDENT_AMBULATORY_CARE_PROVIDER_SITE_OTHER): Payer: Medicare Other | Admitting: Podiatry

## 2020-10-04 ENCOUNTER — Other Ambulatory Visit: Payer: Self-pay

## 2020-10-04 DIAGNOSIS — Q828 Other specified congenital malformations of skin: Secondary | ICD-10-CM

## 2020-10-04 DIAGNOSIS — M79675 Pain in left toe(s): Secondary | ICD-10-CM

## 2020-10-04 DIAGNOSIS — G629 Polyneuropathy, unspecified: Secondary | ICD-10-CM | POA: Diagnosis not present

## 2020-10-04 DIAGNOSIS — M79674 Pain in right toe(s): Secondary | ICD-10-CM

## 2020-10-04 DIAGNOSIS — B351 Tinea unguium: Secondary | ICD-10-CM | POA: Diagnosis not present

## 2020-10-06 NOTE — Progress Notes (Signed)
Subjective: 86 y.o. returns the office today for painful, elongated, thickened toenails which she cannot trim herself as well as for a callus on the right foot. Denies any redness or drainage around the nails.no new concerns.  Denies any systemic complaints such as fevers, chills, nausea, vomiting.   PCP: [name], MD  Objective: NAD- presents with daughter  DP/PT pulses palpable, CRT less than 3 seconds Nails hypertrophic, dystrophic, elongated, brittle, discolored 10. There is tenderness overlying the nails 1-5 bilaterally. There is no surrounding erythema or drainage along the nail sites. Minimal hyperkeratotic lesion right submetatarsal 5 without any underlying ulceration, drainage, signs of infection.  No open lesions or pre-ulcerative lesions are identified. No pain with calf compression, swelling, warmth, erythema.  Assessment: Patient presents with symptomatic onychomycosis; hyperkeratotic lesion  Plan: -Treatment options including alternatives, risks, complications were discussed -Nails sharply debrided 10 without complication/bleeding. -Hyperkeratotic lesion sharply debrided x 1 without any complications or bleeding.  -Discussed daily foot inspection. If there are any changes, to call the office immediately.  -Follow-up in 3 months or sooner if any problems are to arise. In the meantime, encouraged to call the office with any questions, concerns, changes symptoms.  Celesta Gentile, DPM

## 2020-11-21 ENCOUNTER — Other Ambulatory Visit: Payer: Self-pay | Admitting: Internal Medicine

## 2020-11-21 DIAGNOSIS — Z1231 Encounter for screening mammogram for malignant neoplasm of breast: Secondary | ICD-10-CM

## 2021-01-06 ENCOUNTER — Ambulatory Visit: Payer: Medicare Other

## 2021-02-07 ENCOUNTER — Ambulatory Visit: Payer: Medicare Other | Admitting: Podiatry

## 2021-02-11 ENCOUNTER — Other Ambulatory Visit: Payer: Self-pay

## 2021-02-11 ENCOUNTER — Ambulatory Visit (INDEPENDENT_AMBULATORY_CARE_PROVIDER_SITE_OTHER): Payer: Medicare Other | Admitting: Podiatry

## 2021-02-11 ENCOUNTER — Encounter: Payer: Self-pay | Admitting: Podiatry

## 2021-02-11 DIAGNOSIS — G629 Polyneuropathy, unspecified: Secondary | ICD-10-CM | POA: Diagnosis not present

## 2021-02-11 DIAGNOSIS — B351 Tinea unguium: Secondary | ICD-10-CM | POA: Diagnosis not present

## 2021-02-11 DIAGNOSIS — M79674 Pain in right toe(s): Secondary | ICD-10-CM | POA: Diagnosis not present

## 2021-02-11 DIAGNOSIS — Q828 Other specified congenital malformations of skin: Secondary | ICD-10-CM | POA: Diagnosis not present

## 2021-02-11 DIAGNOSIS — M79675 Pain in left toe(s): Secondary | ICD-10-CM | POA: Diagnosis not present

## 2021-02-11 NOTE — Progress Notes (Signed)
Subjective: 87 y.o. returns the office today for painful, elongated, thickened toenails which she cannot trim herself as well as for a callus on the right foot. Denies any redness or drainage around the nails.no new concerns.  No open lesions she reports. Denies any systemic complaints such as fevers, chills, nausea, vomiting.   PCP: [name], MD  Objective: NAD- presents with daughter  DP/PT pulses palpable, CRT less than 3 seconds Nails hypertrophic, dystrophic, elongated, brittle, discolored 10. There is tenderness overlying the nails 1-5 bilaterally. There is no surrounding erythema or drainage along the nail sites. Minimal hyperkeratotic lesion right submetatarsal 5 without any underlying ulceration, drainage, signs of infection.  No open lesions or pre-ulcerative lesions are identified. No pain with calf compression, swelling, warmth, erythema.  Assessment: Patient presents with symptomatic onychomycosis; hyperkeratotic lesion  Plan: -Treatment options including alternatives, risks, complications were discussed -Nails sharply debrided 10 without complication/bleeding. -Hyperkeratotic lesion sharply debrided x 1 without any complications or bleeding.  -Discussed daily foot inspection. If there are any changes, to call the office immediately.  -Follow-up in 3 months or sooner if any problems are to arise. In the meantime, encouraged to call the office with any questions, concerns, changes symptoms.  Celesta Gentile, DPM

## 2021-02-24 ENCOUNTER — Other Ambulatory Visit: Payer: Self-pay | Admitting: Internal Medicine

## 2021-02-24 ENCOUNTER — Other Ambulatory Visit: Payer: Self-pay

## 2021-02-24 ENCOUNTER — Ambulatory Visit
Admission: RE | Admit: 2021-02-24 | Discharge: 2021-02-24 | Disposition: A | Discharge status: Home / Self Care | Payer: Medicare Other | Source: Ambulatory Visit | Attending: Internal Medicine | Admitting: Internal Medicine

## 2021-02-24 DIAGNOSIS — Z1231 Encounter for screening mammogram for malignant neoplasm of breast: Secondary | ICD-10-CM

## 2021-02-25 ENCOUNTER — Encounter: Payer: Self-pay | Admitting: Emergency Medicine

## 2021-02-25 ENCOUNTER — Ambulatory Visit
Admission: EM | Admit: 2021-02-25 | Discharge: 2021-02-25 | Disposition: A | Discharge status: Home / Self Care | Payer: Medicare Other | Attending: Family Medicine | Admitting: Family Medicine

## 2021-02-25 ENCOUNTER — Telehealth: Payer: Self-pay | Admitting: Emergency Medicine

## 2021-02-25 DIAGNOSIS — T783XXA Angioneurotic edema, initial encounter: Secondary | ICD-10-CM

## 2021-02-25 MED ORDER — PREDNISONE 10 MG PO TABS: 20.0000 mg | ORAL_TABLET | Freq: Every day | ORAL | 0 refills | Status: AC

## 2021-02-25 MED ORDER — FAMOTIDINE 40 MG PO TABS: 40.0000 mg | ORAL_TABLET | Freq: Two times a day (BID) | ORAL | 0 refills | Status: DC

## 2021-02-25 MED ORDER — PREDNISONE 20 MG PO TABS: 20.0000 mg | ORAL_TABLET | Freq: Every day | ORAL | 0 refills | Status: AC

## 2021-02-25 MED ORDER — DEXAMETHASONE SODIUM PHOSPHATE 10 MG/ML IJ SOLN
10.0000 mg | Freq: Once | INTRAMUSCULAR | Status: AC
  Administered 2021-02-25: 10 mg via INTRAMUSCULAR

## 2021-02-25 MED ORDER — FAMOTIDINE 40 MG PO TABS
40.0000 mg | ORAL_TABLET | ORAL | Status: AC
  Administered 2021-02-25: 40 mg via ORAL

## 2021-02-25 NOTE — ED Provider Notes (Signed)
[facility]    CSN: [id number] Arrival date & time: 02/25/21  0630      History   Chief Complaint Chief Complaint  Patient presents with  . Facial Swelling    HPI Ariana Carney is a 87 y.o. female.   HPI  Patient presents today with lower lip and jaw swelling. Family is assisting with history. Reports that patient ate seafood yesterday and normally eats fish with occasional eats shrimp. Yesterday ate shrimp and fish and awaken today with facial swelling. Patient also takes Lisinopril-HCTZ reports she took medication this morning and lip swelling was already present. Last seen by PCP 01/2021, unable to see labs therefore unknown of as to current renal function. Denies any pain with swallowing, rash, itching of throat, cough, nausea, vomiting or chills.  Past Medical History:  Diagnosis Date  . Breast cancer ([place]) 2007   right  . 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 Heart disease   . Hypertension   . Thyroid disease   . Ulcer     Patient Active Problem List   Diagnosis Date Noted  . 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 NECK PAIN, CHRONIC 06/17/2008  . LOW BACK PAIN 06/17/2008    Past Surgical History:  Procedure Laterality Date  . ABDOMINAL HYSTERECTOMY    . CHOLECYSTECTOMY    . MASTECTOMY Left 10+ years ago    OB History   No obstetric history on file.      Home Medications    Prior to Admission medications   Medication Sig Start Date End Date Taking? Authorizing Provider  cyanocobalamin (,VITAMIN B-12,) 1000 MCG/ML injection Inject 1,000 mcg into the muscle every 30 (thirty) days.    [provider]  digoxin (LANOXIN) 0.125 MG tablet Take 125 mcg by mouth daily.      [provider]  famotidine (PEPCID) 10 MG tablet Take 40 mg by mouth daily.     [provider]  levothyroxine (SYNTHROID, LEVOTHROID) 50 MCG tablet Take 50 mcg by mouth daily.      [provider]  lisinopril-hydrochlorothiazide (PRINZIDE,ZESTORETIC) 10-12.5 MG per tablet Take 1 tablet by mouth daily.    [provider]  mirtazapine (REMERON) 15 MG tablet Take 15 mg by mouth at bedtime.      [provider]  NONFORMULARY OR COMPOUNDED ITEM Apply 1 g topically daily. Ketam./Bacl./gaba/amit/cloni/bupiv Apply to feet    [provider]  oxybutynin (DITROPAN) 5 MG tablet  09/09/20   [provider]  oxybutynin (DITROPAN-XL) 5 MG 24 hr tablet Take 5 mg by mouth daily.    [provider]  pregabalin (LYRICA) 50 MG capsule Take 50 mg by mouth 3 (three) times daily.    [provider]    Family History No family history on file.  Social History Social History   Tobacco Use  . Smoking status: Current Some Day Smoker    Types: Cigarettes  . Smokeless tobacco: Never Used  Substance Use Topics  . Alcohol use: No  . Drug use: No     Allergies   Patient has no known allergies.  Review of Systems Review of Systems Pertinent negatives listed in HPI  Physical Exam Triage Vital Signs ED Triage Vitals  Enc Vitals Group     BP 02/25/21 0934 (!) 151/80     Pulse Rate 02/25/21 0934 67     Resp 02/25/21 0934 18     Temp  02/25/21 0934 97.9 F (36.6 C)     Temp Source 02/25/21 0934 Oral     SpO2 02/25/21 0934 96 %     Weight --      Height --      Head Circumference --      Peak Flow --      Pain Score 02/25/21 0938 0     Pain Loc --      Pain Edu? --      Excl. in [place]? --    No data found.  Updated Vital Signs BP (!) 151/80   Pulse 67   Temp 97.9 F (36.6 C) (Oral)   Resp 18   SpO2 96%   Visual Acuity Right Eye Distance:   Left Eye Distance:   Bilateral Distance:    Right Eye Near:   Left Eye Near:    Bilateral Near:     Physical Exam Constitutional:      Appearance: She is not toxic-appearing or diaphoretic.  HENT:     Head:   Cardiovascular:     Rate and Rhythm: Normal rate and regular rhythm.   Musculoskeletal:     Cervical back: Normal range of motion.  Lymphadenopathy:     Cervical: No cervical adenopathy.  Neurological:     GCS: GCS eye subscore is 4. GCS verbal subscore is 5. GCS motor subscore is 6.     Cranial Nerves: Cranial nerves are intact.     Motor: Motor function is intact.     Coordination: Coordination is intact.  Psychiatric:        Attention and Perception: Attention normal.        Mood and Affect: Mood normal.        Speech: Speech normal.        Behavior: Behavior normal.      UC Treatments / Results  Labs (all labs ordered are listed, but only abnormal results are displayed) Labs Reviewed - No data to display  EKG   Radiology MM 3D SCREEN BREAST UNI LEFT  Result Date: 02/24/2021 CLINICAL DATA:  Screening. EXAM: DIGITAL SCREENING UNILATERAL LEFT MAMMOGRAM WITH CAD AND TOMOSYNTHESIS TECHNIQUE: Left screening digital craniocaudal and mediolateral oblique mammograms were obtained. Left screening digital breast tomosynthesis was performed. The images were evaluated with computer-aided detection. COMPARISON:  Previous exam(s). ACR Breast Density Category b: There are scattered areas of fibroglandular density. FINDINGS: The patient has had a right mastectomy. There are no findings suspicious for malignancy. IMPRESSION: No mammographic evidence of malignancy. A result letter of this screening mammogram will be mailed directly to the patient. RECOMMENDATION: Screening mammogram in one year.  (Code:SM-L-77M) BI-RADS CATEGORY  1: Negative. Electronically Signed   By: Fidela Salisbury M.D.   On: 02/24/2021 15:53    Procedures Procedures (including critical care time)  Medications Ordered in UC Medications - No data to display  Initial Impression / Assessment and Plan / UC Course  I have reviewed the triage vital signs and the nursing notes.  Pertinent labs & imaging results that were available during my care of the patient were reviewed by me and  considered in my medical decision making (see chart for details).    Seafood allergic vs ACE inhibitor angioedema. Discussed with patient and family, agreed to trial treatment with steroids and famotidine. If symptoms do not resolve, worsen, or remain unchanged, advised to go immediately to the ER as ACE induced angioedema can result in life threatening emergency.  IM Decadron and oral famotidine given  in clinic. Start oral prednisone tomorrow. Take second dose of famotidine this evening. verbalized understanding and agreement with plan.  Final Clinical Impressions(s) / UC Diagnoses   Final diagnoses:  Angioedema of lips, initial encounter     Discharge Instructions     Treating for an allergic reaction to suspected shell fish however I cannot rule out reaction to Lisinopril which can develop at anytime while taking medication. If lip swelling doesn't resolve or worsens or remains unchanged by end of the day, please go to the emergency department for evaluation as this can be a medical emergency if related to medication reaction.  If symptoms resolve, continue with treatment prescribed and follow-up with PCP as needed    ED Prescriptions    Medication Sig Dispense Auth. Provider   famotidine (PEPCID) 40 MG tablet Take 1 tablet (40 mg total) by mouth 2 (two) times daily for 5 days. 10 tablet Scot Jun, FNP   predniSONE (DELTASONE) 20 MG tablet Take 1 tablet (20 mg total) by mouth daily with breakfast for 5 days. 5 tablet Scot Jun, FNP     PDMP not reviewed this encounter.   Scot Jun, FNP 02/25/21 [id number]

## 2021-02-25 NOTE — ED Triage Notes (Signed)
Facial swelling that started this morning.  Pt ate shellfish last night but no know prior allergy.

## 2021-02-25 NOTE — Discharge Instructions (Addendum)
Treating for an allergic reaction to suspected shell fish however I cannot rule out reaction to Lisinopril which can develop at anytime while taking medication. If lip swelling doesn't resolve or worsens or remains unchanged by end of the day, please go to the emergency department for evaluation as this can be a medical emergency if related to medication reaction.  If symptoms resolve, continue with treatment prescribed and follow-up with PCP as needed

## 2021-05-20 ENCOUNTER — Ambulatory Visit
Admission: EM | Admit: 2021-05-20 | Discharge: 2021-05-20 | Disposition: A | Discharge status: Home / Self Care | Payer: Medicare Other | Attending: Emergency Medicine | Admitting: Emergency Medicine

## 2021-05-20 ENCOUNTER — Other Ambulatory Visit: Payer: Self-pay

## 2021-05-20 DIAGNOSIS — R220 Localized swelling, mass and lump, head: Secondary | ICD-10-CM

## 2021-05-20 DIAGNOSIS — T7840XA Allergy, unspecified, initial encounter: Secondary | ICD-10-CM | POA: Diagnosis not present

## 2021-05-20 MED ORDER — FAMOTIDINE 20 MG PO TABS
20.0000 mg | ORAL_TABLET | Freq: Once | ORAL | Status: AC
  Administered 2021-05-20: 20 mg via ORAL

## 2021-05-20 MED ORDER — FAMOTIDINE 20 MG PO TABS: 20.0000 mg | ORAL_TABLET | Freq: Two times a day (BID) | ORAL | 0 refills | Status: DC

## 2021-05-20 MED ORDER — DEXAMETHASONE SODIUM PHOSPHATE 10 MG/ML IJ SOLN
10.0000 mg | Freq: Once | INTRAMUSCULAR | Status: AC
  Administered 2021-05-20: 10 mg via INTRAMUSCULAR

## 2021-05-20 MED ORDER — PREDNISONE 20 MG PO TABS: 20.0000 mg | ORAL_TABLET | Freq: Two times a day (BID) | ORAL | 0 refills | Status: AC

## 2021-05-20 NOTE — ED Triage Notes (Signed)
Pt presents with facial swelling in lips, denies sob , benadryl has been given

## 2021-05-20 NOTE — ED Provider Notes (Signed)
[facility]   [id number] 05/20/21 Arrival Time: [id number]  Cc: Allergic reaction  SUBJECTIVE:  Ariana Carney is a 87 y.o. female who presents with possible allergic reaction that began this morning.  Reports facial swelling.  Denies precipitating event, exposure, known allergy or trigger. Denies changes in medication or starting a new medication.  Has tried benadryl without relief.  Denies aggravating factors.  Reports previous symptoms in the past.  Concerned for angioedema secondary to ace-I use.  Patient did not follow up with PC.  Denies fever, chills, nausea, vomiting, erythema, swollen glands, oral manifestations such as throat swelling/ tingling, mouth swelling/ tingling, tongue swelling/tingling, dyspnea, SOB, chest pain, abdominal pain, changes in bowel or bladder function.     ROS: As per HPI.  All other pertinent ROS negative.     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   Current Outpatient Medications on File Prior to Encounter  Medication Sig Dispense Refill   cyanocobalamin (,VITAMIN B-12,) 1000 MCG/ML injection Inject 1,000 mcg into the muscle every 30 (thirty) days.     digoxin (LANOXIN) 0.125 MG tablet Take 125 mcg by mouth daily.       levothyroxine (SYNTHROID, LEVOTHROID) 50 MCG tablet Take 50 mcg by mouth daily.       lisinopril-hydrochlorothiazide (PRINZIDE,ZESTORETIC) 10-12.5 MG per tablet Take 1 tablet by mouth daily.     mirtazapine (REMERON) 15 MG tablet Take 15 mg by mouth at bedtime.       NONFORMULARY OR COMPOUNDED ITEM Apply 1 g topically daily.  Ketam./Bacl./gaba/amit/cloni/bupiv Apply to feet     oxybutynin (DITROPAN) 5 MG tablet      oxybutynin (DITROPAN-XL) 5 MG 24 hr tablet Take 5 mg by mouth daily.     pregabalin (LYRICA) 50 MG capsule Take 50 mg by mouth 3 (three) times daily.      Social History   Socioeconomic History   Marital status: Widowed    Spouse name: Not on file   Number of children: Not on file   Years of education: Not on file   Highest education level: Not on file  Occupational History   Not on file  Tobacco Use   Smoking status: Some Days    Types: Cigarettes   Smokeless tobacco: Never  Substance and Sexual Activity   Alcohol use: No   Drug use: No   Sexual activity: Not on file  Other Topics Concern   Not on file  Social History Narrative   Not on file   Social Determinants of Health   Financial Resource Strain: Not on file  Food Insecurity: Not on file  Transportation Needs: Not on file  Physical Activity: Not on file  Stress: Not on file  Social Connections: Not on file  Intimate Partner Violence: Not on file   No family history on file.   OBJECTIVE:  Vitals:   05/20/21 1401  BP: 138/79  Pulse: 67  Resp: 18  Temp: 97.9 F (36.6 C)  SpO2: 96%     General appearance: Alert, speaking in full  sentences without difficulty HEENT:NCAT; facial and lip swelling; Ears: EACs clear, TMs pearly gray; Eyes: PERRL.  EOM grossly intact. Nose: nares patent without rhinorrhea; Throat: tonsils nonerythematous or enlarged, uvula midline Neck: supple without LAD Lungs: clear to auscultation bilaterally without adventitious breath sounds; normal respiratory effort; no labored respirations Heart: regular rate and rhythm.   Abdomen: soft, nondistended, normal active bowel sounds; nontender to palpation; no guarding  Skin: warm and dry Psychological: alert and cooperative; normal mood and affect  ASSESSMENT & PLAN:  1. Allergic reaction, initial encounter   2. Facial swelling     Meds  ordered this encounter  Medications   dexamethasone (DECADRON) injection 10 mg   famotidine (PEPCID) 20 MG tablet    Sig: Take 1 tablet (20 mg total) by mouth 2 (two) times daily for 5 days.    Dispense:  10 tablet    Refill:  0    Order Specific Question:   Supervising Provider    Answer:   Raylene Everts [id number]   predniSONE (DELTASONE) 20 MG tablet    Sig: Take 1 tablet (20 mg total) by mouth 2 (two) times daily with a meal for 5 days.    Dispense:  10 tablet    Refill:  0    Order Specific Question:   Supervising Provider    Answer:   Raylene Everts [id number]   famotidine (PEPCID) tablet 20 mg    Decadron 10 mg shot given in office Famotidine 20 mg given in office.   Rest push fluids Prednisone 40 mg daily for 3 days prescribed.  Take as directed and to completion. Benadryl 25 mg prescribed.  Take as directed for 3 days. Pepcid 20 mg twice daily for 3 days Follow up with PCP for recheck.  If you do not continue to improve over the day please call 911 or go to the ED Go to the ED if you have any new or worsening symptoms such as difficulty breathing, shortness of breath, chest pain, nausea, vomiting, throat tightness or swelling, tongue swelling or tingling, worsening lip or facial swelling, abdominal pain, changes in bowel or bladder habits, no improvement despite medications, etc...   Reviewed expectations re: course of current medical issues. Questions answered. Outlined signs and symptoms indicating need for more acute intervention. Patient verbalized understanding. After Visit Summary given.           Lestine Box, PA-C 05/20/21 1502

## 2021-05-20 NOTE — Discharge Instructions (Addendum)
Decadron 10 mg shot given in office Famotidine 20 mg given in office.   Rest push fluids Prednisone 40 mg daily for 3 days prescribed.  Take as directed and to completion. Benadryl 25 mg prescribed.  Take as directed for 3 days. Pepcid 20 mg twice daily for 3 days Follow up with PCP for recheck.  If you do not continue to improve over the day please call 911 or go to the ED Go to the ED if you have any new or worsening symptoms such as difficulty breathing, shortness of breath, chest pain, nausea, vomiting, throat tightness or swelling, tongue swelling or tingling, worsening lip or facial swelling, abdominal pain, changes in bowel or bladder habits, no improvement despite medications, etc..Marland Kitchen

## 2021-06-08 ENCOUNTER — Ambulatory Visit: Payer: Medicare Other | Admitting: Podiatry

## 2021-06-20 ENCOUNTER — Ambulatory Visit (INDEPENDENT_AMBULATORY_CARE_PROVIDER_SITE_OTHER): Payer: Medicare Other | Admitting: Podiatry

## 2021-06-20 ENCOUNTER — Other Ambulatory Visit: Payer: Self-pay

## 2021-06-20 DIAGNOSIS — E538 Deficiency of other specified B group vitamins: Secondary | ICD-10-CM

## 2021-06-20 DIAGNOSIS — M79674 Pain in right toe(s): Secondary | ICD-10-CM

## 2021-06-20 DIAGNOSIS — G629 Polyneuropathy, unspecified: Secondary | ICD-10-CM

## 2021-06-20 DIAGNOSIS — B351 Tinea unguium: Secondary | ICD-10-CM | POA: Diagnosis not present

## 2021-06-20 DIAGNOSIS — M79675 Pain in left toe(s): Secondary | ICD-10-CM

## 2021-06-20 DIAGNOSIS — L84 Corns and callosities: Secondary | ICD-10-CM

## 2021-06-24 ENCOUNTER — Encounter: Payer: Self-pay | Admitting: Podiatry

## 2021-06-24 NOTE — Progress Notes (Signed)
Subjective: Ariana Carney is a 87 y.o. female patient seen today with h/o Vitamin B-12 deficiency with peripheral neuropathy. She is seen  for follow up of  painful thick toenails that are difficult to trim. Pain interferes with ambulation. Aggravating factors include wearing enclosed shoe gear. Pain is relieved with periodic professional debridement.  Her daughter is present during today's visit. She voices no new pedal problems on today's visit.  PCP is [name], MD. Last visit was: 07/01/2020.  New problems reported today: None.  No Known Allergies  Objective: Physical Exam  General: Patient is a pleasant 87 y.o. African American female in NAD. AAO x 3.   Neurovascular Examination: Capillary refill time to digits <3 seconds b/l. Palpable pedal pulses b/l LE. Pedal hair absent. Lower extremity skin temperature gradient within normal limits. No edema noted b/l lower extremities.   Protective sensation intact 5/5 intact bilaterally with 10g monofilament b/l. Vibratory sensation intact b/l.  Dermatological:  Skin warm and supple b/l lower extremities. No open wounds b/l lower extremities. No interdigital macerations b/l lower extremities. Toenails 1-5 b/l elongated, discolored, dystrophic, thickened, crumbly with subungual debris and tenderness to dorsal palpation. Hyperkeratotic lesion(s) right foot with tenderness to palpation. No edema, no erythema, no drainage, no fluctuance.   Musculoskeletal:  Normal muscle strength 5/5 to all lower extremity muscle groups bilaterally. No pain crepitus or joint limitation noted with ROM b/l lower extremities.  Gross deformities: Normal muscle strength 5/5 to all lower extremity muscle groups bilaterally. Hallux valgus with bunion deformity noted b/l lower extremities.  Assessment: 1. Pain due to onychomycosis of toenails of both feet   2. Callus   3. Vitamin B12 deficiency   4. Neuropathy    Plan: -Examined patient. -Patient to  continue soft, supportive shoe gear daily. -Toenails 1-5 b/l were debrided in length and girth with sterile nail nippers and dremel without iatrogenic bleeding.  -Callus(es) right foot pared utilizing sterile scalpel blade without complication or incident. Total number debrided =1. -Patient to report any pedal injuries to medical professional immediately. -Patient/POA to call should there be question/concern in the interim.  Return in about 3 months (around 09/20/2021).  Marzetta Board, DPM

## 2021-07-11 DIAGNOSIS — E538 Deficiency of other specified B group vitamins: Secondary | ICD-10-CM | POA: Diagnosis not present

## 2021-07-11 DIAGNOSIS — I10 Essential (primary) hypertension: Secondary | ICD-10-CM | POA: Diagnosis not present

## 2021-07-11 DIAGNOSIS — R634 Abnormal weight loss: Secondary | ICD-10-CM | POA: Diagnosis not present

## 2021-07-11 DIAGNOSIS — Z79899 Other long term (current) drug therapy: Secondary | ICD-10-CM | POA: Diagnosis not present

## 2021-07-11 DIAGNOSIS — E039 Hypothyroidism, unspecified: Secondary | ICD-10-CM | POA: Diagnosis not present

## 2021-07-18 DIAGNOSIS — Z23 Encounter for immunization: Secondary | ICD-10-CM | POA: Diagnosis not present

## 2021-07-18 DIAGNOSIS — E039 Hypothyroidism, unspecified: Secondary | ICD-10-CM | POA: Diagnosis not present

## 2021-07-18 DIAGNOSIS — I10 Essential (primary) hypertension: Secondary | ICD-10-CM | POA: Diagnosis not present

## 2021-07-18 DIAGNOSIS — N1831 Chronic kidney disease, stage 3a: Secondary | ICD-10-CM | POA: Diagnosis not present

## 2021-09-27 ENCOUNTER — Encounter: Payer: Self-pay | Admitting: Emergency Medicine

## 2021-09-27 ENCOUNTER — Ambulatory Visit
Admission: EM | Admit: 2021-09-27 | Discharge: 2021-09-27 | Disposition: A | Discharge status: Home / Self Care | Payer: Medicare Other | Attending: Family Medicine | Admitting: Family Medicine

## 2021-09-27 ENCOUNTER — Other Ambulatory Visit: Payer: Self-pay

## 2021-09-27 DIAGNOSIS — B029 Zoster without complications: Secondary | ICD-10-CM

## 2021-09-27 DIAGNOSIS — D0510 Intraductal carcinoma in situ of unspecified breast: Secondary | ICD-10-CM

## 2021-09-27 MED ORDER — PREGABALIN 50 MG PO CAPS: 50.0000 mg | ORAL_CAPSULE | Freq: Two times a day (BID) | ORAL | 0 refills | Status: DC

## 2021-09-27 MED ORDER — VALACYCLOVIR HCL 1 G PO TABS: 1000.0000 mg | ORAL_TABLET | Freq: Three times a day (TID) | ORAL | 0 refills | Status: DC

## 2021-09-27 MED ORDER — PREDNISONE 20 MG PO TABS: 40.0000 mg | ORAL_TABLET | Freq: Every day | ORAL | 0 refills | Status: DC

## 2021-09-27 NOTE — ED Provider Notes (Signed)
[facility]    CSN: [id number] Arrival date & time: 09/27/21  1649      History   Chief Complaint Chief Complaint  Patient presents with   Rash    HPI Ariana Carney is a 87 y.o. female.   Presenting today with 2-day history of significant blistering rash across right flank.  Denies any new medications, hygiene products, history of similar rashes or chronic dermatologic issues, fever, chills.  So far trying Vaseline with minimal relief.   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      Home Medications    Prior to Admission medications   Medication Sig Start Date End Date Taking? Authorizing Provider  predniSONE (DELTASONE) 20 MG tablet Take 2 tablets (40 mg total) by mouth daily with breakfast. 09/27/21  Yes Volney American, PA-C  valACYclovir (VALTREX) 1000 MG tablet Take 1 tablet (1,000 mg total) by mouth 3 (three) times daily. 09/27/21  Yes Volney American, PA-C  cyanocobalamin (,VITAMIN B-12,) 1000 MCG/ML injection Inject 1,000 mcg into the muscle every 30 (thirty) days.    [provider]  digoxin (LANOXIN) 0.125 MG tablet Take 125 mcg by mouth daily.      [provider]  famotidine (PEPCID) 20 MG tablet Take 1 tablet (20 mg total) by mouth 2 (two) times daily for 5 days. 05/20/21 05/25/21  [place], [place], PA-C  levothyroxine (SYNTHROID, LEVOTHROID) 50 MCG tablet Take 50 mcg by mouth daily.      [provider]  lisinopril-hydrochlorothiazide (PRINZIDE,ZESTORETIC) 10-12.5 MG per tablet Take 1 tablet by mouth daily.    [provider]  mirtazapine (REMERON) 15 MG tablet Take 15 mg by mouth at bedtime.      [provider]  NONFORMULARY OR COMPOUNDED ITEM Apply 1 g topically daily. Ketam./Bacl./gaba/amit/cloni/bupiv Apply to feet    [provider]  oxybutynin (DITROPAN) 5 MG tablet  09/09/20   [provider]  oxybutynin (DITROPAN-XL) 5 MG 24 hr tablet Take 5 mg by mouth daily.    [provider]  pregabalin (LYRICA) 50 MG capsule Take 1 capsule (50 mg total) by mouth 2 (two) times daily. 09/27/21   Volney American, PA-C    Family History History reviewed. No pertinent family history.  Social History Social History   Tobacco Use   Smoking status: Some Days    Types: Cigarettes   Smokeless tobacco: Never  Substance Use Topics   Alcohol use: No   Drug use: No     Allergies   Patient has no known allergies.   Review of Systems Review of Systems Per HPI  Physical Exam Triage Vital Signs ED Triage Vitals  Enc Vitals Group     BP 09/27/21 1928 (!) 152/64     Pulse Rate 09/27/21 1928 73     Resp 09/27/21 1928  18     Temp 09/27/21 1928 98.4 F (36.9 C)     Temp Source 09/27/21 1928 Oral     SpO2 09/27/21 1928 96 %     Weight 09/27/21 1929 107 lb (48.5 kg)     Height 09/27/21 1929 5\' 2"  (1.575 m)     Head Circumference --      Peak Flow --      Pain Score 09/27/21 1928 2     Pain Loc --      Pain Edu? --      Excl. in [place]? --    No data found.  Updated Vital Signs BP (!) 152/64 (BP Location: Right Arm)   Pulse 73   Temp 98.4 F (36.9 C) (Oral)   Resp 18   Ht 5\' 2"  (1.575 m)   Wt 107 lb (48.5 kg)   SpO2 96%   BMI 19.57 kg/m   Visual Acuity Right Eye Distance:   Left Eye Distance:   Bilateral Distance:    Right Eye Near:   Left Eye Near:    Bilateral Near:     Physical Exam Vitals and nursing  note reviewed.  Constitutional:      Appearance: Normal appearance. She is not ill-appearing.  HENT:     Head: Atraumatic.  Eyes:     Extraocular Movements: Extraocular movements intact.     Conjunctiva/sclera: Conjunctivae normal.  Cardiovascular:     Rate and Rhythm: Normal rate and regular rhythm.     Heart sounds: Normal heart sounds.  Pulmonary:     Effort: Pulmonary effort is normal.     Breath sounds: Normal breath sounds.  Musculoskeletal:        General: Normal range of motion.     Cervical back: Normal range of motion and neck supple.  Skin:    General: Skin is warm and dry.     Findings: Rash present.     Comments: Erythematous blistering rash in linear pattern right flank, not crossing midline  Neurological:     Mental Status: She is alert and oriented to person, place, and time.  Psychiatric:        Mood and Affect: Mood normal.        Thought Content: Thought content normal.        Judgment: Judgment normal.   UC Treatments / Results  Labs (all labs ordered are listed, but only abnormal results are displayed) Labs Reviewed - No data to display  EKG   Radiology No results found.  Procedures Procedures (including critical care time)  Medications Ordered in UC Medications - No data to display  Initial Impression / Assessment and Plan / UC Course  I have reviewed the triage vital signs and the nursing notes.  Pertinent labs & imaging results that were available during my care of the patient were reviewed by me and considered in my medical decision making (see chart for details).     Will treat for shingles with refill of lyrica as she is currently out, valtrex, prednisone. Discussed home care and return precautions.   Final Clinical Impressions(s) / UC Diagnoses   Final diagnoses:  Herpes zoster without complication   Discharge Instructions   None    ED Prescriptions     Medication Sig Dispense Auth. Provider   pregabalin (LYRICA) 50 MG  capsule Take 1 capsule (50 mg total) by mouth 2 (two) times daily. 28 capsule Volney American, [place]   predniSONE (DELTASONE) 20 MG tablet Take  2 tablets (40 mg total) by mouth daily with breakfast. 10 tablet Volney American, PA-C   valACYclovir (VALTREX) 1000 MG tablet Take 1 tablet (1,000 mg total) by mouth 3 (three) times daily. 21 tablet Volney American, [place]      PDMP not reviewed this encounter.   [place], [place], [place] 10/02/21 2150

## 2021-09-27 NOTE — ED Triage Notes (Addendum)
Pt reports right sided rash that started x2 days ago. Site is scaley,blistered, and red. Pt reports soreness to touch. No drainage noted.

## 2021-09-29 DIAGNOSIS — B0233 Zoster keratitis: Secondary | ICD-10-CM | POA: Diagnosis not present

## 2021-10-06 DIAGNOSIS — B0233 Zoster keratitis: Secondary | ICD-10-CM | POA: Diagnosis not present

## 2021-10-18 ENCOUNTER — Ambulatory Visit (HOSPITAL_COMMUNITY)
Admission: RE | Admit: 2021-10-18 | Discharge: 2021-10-18 | Disposition: A | Discharge status: Home / Self Care | Payer: Medicare Other | Source: Ambulatory Visit | Attending: Internal Medicine | Admitting: Internal Medicine

## 2021-10-18 ENCOUNTER — Other Ambulatory Visit: Payer: Self-pay

## 2021-10-18 ENCOUNTER — Other Ambulatory Visit (HOSPITAL_COMMUNITY): Payer: Self-pay | Admitting: Internal Medicine

## 2021-10-18 DIAGNOSIS — N644 Mastodynia: Secondary | ICD-10-CM

## 2021-10-18 DIAGNOSIS — R079 Chest pain, unspecified: Secondary | ICD-10-CM | POA: Diagnosis not present

## 2021-10-18 DIAGNOSIS — J439 Emphysema, unspecified: Secondary | ICD-10-CM | POA: Diagnosis not present

## 2021-10-24 ENCOUNTER — Ambulatory Visit: Payer: Medicare Other | Admitting: Podiatry

## 2021-10-31 DIAGNOSIS — B0222 Postherpetic trigeminal neuralgia: Secondary | ICD-10-CM | POA: Diagnosis not present

## 2021-11-21 DIAGNOSIS — B0222 Postherpetic trigeminal neuralgia: Secondary | ICD-10-CM | POA: Diagnosis not present

## 2021-11-21 DIAGNOSIS — I10 Essential (primary) hypertension: Secondary | ICD-10-CM | POA: Diagnosis not present

## 2021-12-20 DIAGNOSIS — B0222 Postherpetic trigeminal neuralgia: Secondary | ICD-10-CM | POA: Diagnosis not present

## 2022-01-09 ENCOUNTER — Emergency Department (HOSPITAL_COMMUNITY)
Admission: EM | Admit: 2022-01-09 | Discharge: 2022-01-09 | Disposition: A | Discharge status: Home / Self Care | Payer: Medicare Other | Attending: Emergency Medicine | Admitting: Emergency Medicine

## 2022-01-09 ENCOUNTER — Emergency Department (HOSPITAL_COMMUNITY): Payer: Medicare Other

## 2022-01-09 ENCOUNTER — Encounter (HOSPITAL_COMMUNITY): Payer: Self-pay

## 2022-01-09 ENCOUNTER — Other Ambulatory Visit: Payer: Self-pay

## 2022-01-09 DIAGNOSIS — M1611 Unilateral primary osteoarthritis, right hip: Secondary | ICD-10-CM | POA: Diagnosis not present

## 2022-01-09 DIAGNOSIS — E079 Disorder of thyroid, unspecified: Secondary | ICD-10-CM | POA: Diagnosis not present

## 2022-01-09 DIAGNOSIS — M25539 Pain in unspecified wrist: Secondary | ICD-10-CM | POA: Diagnosis not present

## 2022-01-09 DIAGNOSIS — S2241XA Multiple fractures of ribs, right side, initial encounter for closed fracture: Secondary | ICD-10-CM | POA: Insufficient documentation

## 2022-01-09 DIAGNOSIS — S199XXA Unspecified injury of neck, initial encounter: Secondary | ICD-10-CM | POA: Diagnosis not present

## 2022-01-09 DIAGNOSIS — J479 Bronchiectasis, uncomplicated: Secondary | ICD-10-CM | POA: Diagnosis not present

## 2022-01-09 DIAGNOSIS — Z79899 Other long term (current) drug therapy: Secondary | ICD-10-CM | POA: Diagnosis not present

## 2022-01-09 DIAGNOSIS — I10 Essential (primary) hypertension: Secondary | ICD-10-CM | POA: Diagnosis not present

## 2022-01-09 DIAGNOSIS — E871 Hypo-osmolality and hyponatremia: Secondary | ICD-10-CM | POA: Insufficient documentation

## 2022-01-09 DIAGNOSIS — R0789 Other chest pain: Secondary | ICD-10-CM | POA: Diagnosis not present

## 2022-01-09 DIAGNOSIS — R2689 Other abnormalities of gait and mobility: Secondary | ICD-10-CM | POA: Diagnosis not present

## 2022-01-09 DIAGNOSIS — Z85850 Personal history of malignant neoplasm of thyroid: Secondary | ICD-10-CM | POA: Diagnosis not present

## 2022-01-09 DIAGNOSIS — W19XXXA Unspecified fall, initial encounter: Secondary | ICD-10-CM

## 2022-01-09 DIAGNOSIS — S299XXA Unspecified injury of thorax, initial encounter: Secondary | ICD-10-CM | POA: Diagnosis present

## 2022-01-09 DIAGNOSIS — Z853 Personal history of malignant neoplasm of breast: Secondary | ICD-10-CM | POA: Insufficient documentation

## 2022-01-09 DIAGNOSIS — R413 Other amnesia: Secondary | ICD-10-CM | POA: Insufficient documentation

## 2022-01-09 DIAGNOSIS — W01198A Fall on same level from slipping, tripping and stumbling with subsequent striking against other object, initial encounter: Secondary | ICD-10-CM | POA: Diagnosis not present

## 2022-01-09 DIAGNOSIS — S0990XA Unspecified injury of head, initial encounter: Secondary | ICD-10-CM | POA: Diagnosis not present

## 2022-01-09 DIAGNOSIS — M47817 Spondylosis without myelopathy or radiculopathy, lumbosacral region: Secondary | ICD-10-CM | POA: Diagnosis not present

## 2022-01-09 LAB — COMPREHENSIVE METABOLIC PANEL
ALT: 26 U/L (ref 0–44)
AST: 44 U/L — ABNORMAL HIGH (ref 15–41)
Albumin: 3.5 g/dL (ref 3.5–5.0)
Alkaline Phosphatase: 45 U/L (ref 38–126)
Anion gap: 9 (ref 5–15)
BUN: 13 mg/dL (ref 8–23)
CO2: 29 mmol/L (ref 22–32)
Calcium: 8.6 mg/dL — ABNORMAL LOW (ref 8.9–10.3)
Chloride: 94 mmol/L — ABNORMAL LOW (ref 98–111)
Creatinine, Ser: 0.95 mg/dL (ref 0.44–1.00)
GFR, Estimated: 54 mL/min — ABNORMAL LOW (ref >60)
Glucose, Bld: 101 mg/dL — ABNORMAL HIGH (ref 70–99)
Potassium: 3.6 mmol/L (ref 3.5–5.1)
Sodium: 132 mmol/L — ABNORMAL LOW (ref 135–145)
Total Bilirubin: 0.5 mg/dL (ref 0.3–1.2)
Total Protein: 6.5 g/dL (ref 6.5–8.1)

## 2022-01-09 LAB — URINALYSIS, ROUTINE W REFLEX MICROSCOPIC
Bilirubin Urine: NEGATIVE
Glucose, UA: NEGATIVE mg/dL
Ketones, ur: NEGATIVE mg/dL
Nitrite: NEGATIVE
Protein, ur: NEGATIVE mg/dL
Specific Gravity, Urine: 1.01 (ref 1.005–1.030)
pH: 7 (ref 5.0–8.0)

## 2022-01-09 LAB — CBC WITH DIFFERENTIAL/PLATELET
Abs Immature Granulocytes: 0.04 10*3/uL (ref 0.00–0.07)
Basophils Absolute: 0 10*3/uL (ref 0.0–0.1)
Basophils Relative: 0 %
Eosinophils Absolute: 0 10*3/uL (ref 0.0–0.5)
Eosinophils Relative: 0 %
HCT: 30 % — ABNORMAL LOW (ref 36.0–46.0)
Hemoglobin: 10.1 g/dL — ABNORMAL LOW (ref 12.0–15.0)
Immature Granulocytes: 0 %
Lymphocytes Relative: 10 %
Lymphs Abs: 0.9 10*3/uL (ref 0.7–4.0)
MCH: 30.2 pg (ref 26.0–34.0)
MCHC: 33.7 g/dL (ref 30.0–36.0)
MCV: 89.8 fL (ref 80.0–100.0)
Monocytes Absolute: 0.8 10*3/uL (ref 0.1–1.0)
Monocytes Relative: 9 %
Neutro Abs: 7.3 10*3/uL (ref 1.7–7.7)
Neutrophils Relative %: 81 %
Platelets: 266 10*3/uL (ref 150–400)
RBC: 3.34 MIL/uL — ABNORMAL LOW (ref 3.87–5.11)
RDW: 15.9 % — ABNORMAL HIGH (ref 11.5–15.5)
WBC: 9 10*3/uL (ref 4.0–10.5)
nRBC: 0 % (ref 0.0–0.2)

## 2022-01-09 LAB — MAGNESIUM: Magnesium: 1.8 mg/dL (ref 1.7–2.4)

## 2022-01-09 LAB — PHOSPHORUS: Phosphorus: 3.9 mg/dL (ref 2.5–4.6)

## 2022-01-09 LAB — VITAMIN D 25 HYDROXY (VIT D DEFICIENCY, FRACTURES): Vit D, 25-Hydroxy: 12.5 ng/mL — ABNORMAL LOW (ref 30–100)

## 2022-01-09 MED ORDER — ACETAMINOPHEN 325 MG PO TABS
650.0000 mg | ORAL_TABLET | Freq: Once | ORAL | Status: AC
  Administered 2022-01-09: 650 mg via ORAL
  Filled 2022-01-09: qty 2

## 2022-01-09 NOTE — ED Triage Notes (Signed)
Patient with complaints of a fall this morning. She is having right rib and wrist pain. Denies hitting head or LOC.  ?

## 2022-01-09 NOTE — Discharge Instructions (Signed)
No evidenceCT scan of her chest shows that she has 2 broken ribs on the right side.  There was no evidence of a punctured lung.  Her ribs will take several weeks to heal.  The pain should gradually improve.  I recommend Tylenol every 4 hours for pain.  It is important that she cough and take deep breaths several times throughout the day.  She may use a small pillow held to her side to help with this.  Please keep her follow-up appointment with Dr. Willey Blade for next Tuesday.  Return to the emergency department for any new or worsening symptoms. ?

## 2022-01-09 NOTE — ED Provider Notes (Signed)
?[facility] ?Provider Note ? ? ?CSN: [id number] ?Arrival date & time: 01/09/22  2947 ? ?  ? ?History ? ?Chief Complaint  ?Patient presents with  ? Fall  ? ? ?Ariana Carney is a 88 y.o. female. ? ? ?Fall ?Associated symptoms include chest pain (Right chest wall pain). Pertinent negatives include no abdominal pain, no headaches and no shortness of breath.  ? ?  ? ?Ariana Carney is a 88 y.o. female who comes from home and has past medical history significant for hypertension, thyroid disease and breast cancer who presents to the Emergency Department complaining of right rib pain secondary to a fall that occurred this morning.  Patient states that she was getting ready to come to the hospital for routine blood work when she fell going to the bathroom.  Patient's daughter who is at bedside provides most of the history.  Daughter states that she went into her mother's room and found her sitting in the floor next to her bed.  She was complaining of pain of her right ribs and holding her side.  Initially, she also complained of wrist pain but patient states that has since resolved.  Patient denies any preceding symptoms.  She believes that she struck her chest on the side of her bed frame.  She denies any head injury or LOC.  No headache or dizziness.  She denies any shortness of breath or abdominal pain. ? ?Home Medications ?Prior to Admission medications   ?Medication Sig Start Date End Date Taking? Authorizing Provider  ?cyanocobalamin (,VITAMIN B-12,) 1000 MCG/ML injection Inject 1,000 mcg into the muscle every 30 (thirty) days.    [provider]  ?digoxin (LANOXIN) 0.125 MG tablet Take 125 mcg by mouth daily.      [provider]  ?famotidine (PEPCID) 20 MG tablet Take 1 tablet (20 mg total) by mouth 2 (two) times daily for 5 days. 05/20/21 05/25/21  Wurst, [place], PA-C  ?levothyroxine (SYNTHROID, LEVOTHROID) 50 MCG tablet Take 50 mcg by mouth daily.      [provider]  ?lisinopril-hydrochlorothiazide (PRINZIDE,ZESTORETIC) 10-12.5 MG per tablet Take 1 tablet by mouth daily.    [provider]  ?mirtazapine (REMERON) 15 MG tablet Take 15 mg by mouth at bedtime.      [provider]  ?NONFORMULARY OR COMPOUNDED ITEM Apply 1 g topically daily. Ketam./Bacl./gaba/amit/cloni/bupiv ?Apply to feet    [provider]  ?oxybutynin (DITROPAN) 5 MG tablet  09/09/20   [provider]  ?oxybutynin (DITROPAN-XL) 5 MG 24 hr tablet Take 5 mg by mouth daily.    [provider]  ?predniSONE (DELTASONE) 20 MG tablet Take 2 tablets (40 mg total) by mouth daily with breakfast. 09/27/21   Volney American, PA-C  ?pregabalin (LYRICA) 50 MG capsule Take 1 capsule (50 mg total) by mouth 2 (two) times daily. 09/27/21   Volney American, PA-C  ?valACYclovir (VALTREX) 1000 MG tablet Take 1 tablet (1,000 mg total) by mouth 3 (three) times daily. 09/27/21   Volney American, PA-C  ?   ? ?Allergies    ?Patient has no known allergies.   ? ?Review of Systems   ?Review of Systems  ?Eyes:  Negative for pain and visual disturbance.  ?Respiratory:  Negative for shortness of breath.   ?Cardiovascular:  Positive for chest pain (Right chest wall pain). Negative for leg swelling.  ?Gastrointestinal:  Negative for abdominal pain, nausea and vomiting.  ?Genitourinary:  Negative for dysuria.  ?Musculoskeletal:  Negative for arthralgias, back pain and neck pain.  ?Skin:  Negative for wound.  ?Neurological:  Negative for dizziness, syncope, weakness, numbness and headaches.  ?Psychiatric/Behavioral:  Negative for confusion.   ?All other systems reviewed and are negative. ? ?Physical Exam ?Updated Vital Signs ?BP (!) 160/87   Pulse 69   Temp 97.7 ?F (36.5 ?C) (Oral)   Resp 18   Ht '5\' 1"'$  (1.549 m)   Wt 48.5 kg   SpO2 98%   BMI 20.22 kg/m?  ?Physical Exam ?Vitals and nursing note reviewed.  ?Constitutional:   ?   General: She is not in acute  distress. ?   Appearance: Normal appearance.  ?HENT:  ?   Head: Atraumatic.  ?   Mouth/Throat:  ?   Mouth: Mucous membranes are moist.  ?Eyes:  ?   Extraocular Movements: Extraocular movements intact.  ?   Conjunctiva/sclera: Conjunctivae normal.  ?   Pupils: Pupils are equal, round, and reactive to light.  ?Cardiovascular:  ?   Rate and Rhythm: Normal rate and regular rhythm.  ?   Pulses: Normal pulses.  ?Pulmonary:  ?   Effort: Pulmonary effort is normal.  ?   Breath sounds: Normal breath sounds.  ?Chest:  ?   Chest wall: Tenderness (Focal tenderness to palpation of the anterior lateral right lower ribs.  No abrasions, ecchymosis or crepitus.  Patient is guarding right chest wall.) present.  ?Abdominal:  ?   Palpations: There is no mass.  ?   Tenderness: There is no abdominal tenderness. There is no guarding.  ?Musculoskeletal:     ?   General: No swelling or deformity.  ?   Cervical back: Normal range of motion. Tenderness (Tenderness of the right cervical paraspinal muscles.  No midline tenderness or bony step-offs.) present.  ?   Comments: No tenderness palpation of the thoracic or lumbar spine.  Hip flexors and extensors are intact.  No bony tenderness of the pelvis.  Patient moves bilateral upper extremities without difficulty or tenderness.  No bony deformity of the right wrist.  ?Skin: ?   General: Skin is warm.  ?   Findings: No bruising or erythema.  ?Neurological:  ?   General: No focal deficit present.  ?   Mental Status: She is alert.  ?   GCS: GCS eye subscore is 4. GCS verbal subscore is 5. GCS motor subscore is 6.  ?   Cranial Nerves: Cranial nerves 2-12 are intact.  ?   Sensory: Sensation is intact. No sensory deficit.  ?   Motor: Motor function is intact. No weakness.  ? ? ?ED Results / Procedures / Treatments   ?Labs ?(all labs ordered are listed, but only abnormal results are displayed) ?Labs Reviewed  ?CBC WITH DIFFERENTIAL/PLATELET - Abnormal; Notable for the following components:  ?    Result  Value  ? RBC 3.34 (*)   ? Hemoglobin 10.1 (*)   ? HCT 30.0 (*)   ? RDW 15.9 (*)   ? All other components within normal limits  ?COMPREHENSIVE METABOLIC PANEL - Abnormal; Notable for the following components:  ? Sodium 132 (*)   ? Chloride 94 (*)   ? Glucose, Bld 101 (*)   ? Calcium 8.6 (*)   ? AST 44 (*)   ? GFR, Estimated 54 (*)   ? All other components within normal limits  ?URINALYSIS, ROUTINE W REFLEX MICROSCOPIC - Abnormal; Notable for the following components:  ? Color, Urine AMBER (*)   ?  APPearance CLOUDY (*)   ? Hgb urine dipstick MODERATE (*)   ? Leukocytes,Ua SMALL (*)   ? Bacteria, UA RARE (*)   ? All other components within normal limits  ?MAGNESIUM  ?PHOSPHORUS  ?PARATHYROID HORMONE, INTACT (NO CA)  ?VITAMIN D 25 HYDROXY (VIT D DEFICIENCY, FRACTURES)  ? ? ?EKG ?EKG Interpretation ? ?Date/Time:  Tuesday January 09 2022 09:52:08 EDT ?Ventricular Rate:  75 ?PR Interval:  276 ?QRS Duration: 126 ?QT Interval:  444 ?QTC Calculation: 496 ?R Axis:   -36 ?Text Interpretation: Sinus rhythm Prolonged PR interval Left bundle branch block No old tracing to compare Confirmed by Sherwood Gambler [phone]) on 01/09/2022 10:31:43 AM ? ?Radiology ?CT Head Wo Contrast ? ?Result Date: 01/09/2022 ?CLINICAL DATA:  A 88 year old female presents for evaluation of head and neck trauma following fall this morning. EXAM: CT HEAD WITHOUT CONTRAST CT CERVICAL SPINE WITHOUT CONTRAST TECHNIQUE: Multidetector CT imaging of the head and cervical spine was performed following the standard protocol without intravenous contrast. Multiplanar CT image reconstructions of the cervical spine were also generated. RADIATION DOSE REDUCTION: This exam was performed according to the departmental dose-optimization program which includes automated exposure control, adjustment of the mA and/or kV according to patient size and/or use of iterative reconstruction technique. COMPARISON:  Cervical spine radiographs from 2019 neck CT from 2020. FINDINGS: CT HEAD  FINDINGS Brain: No evidence of acute infarction, hemorrhage, hydrocephalus, extra-axial collection or mass lesion/mass effect. Signs of mild generalized atrophy. Vascular: No hyperdense vessel or unexpected calcification.

## 2022-01-10 LAB — PARATHYROID HORMONE, INTACT (NO CA): PTH: 35 pg/mL (ref 15–65)

## 2022-01-12 LAB — URINE CULTURE: Culture: >=100000 — AB

## 2022-01-13 ENCOUNTER — Telehealth: Payer: Self-pay | Admitting: Emergency Medicine

## 2022-01-13 ENCOUNTER — Telehealth (HOSPITAL_BASED_OUTPATIENT_CLINIC_OR_DEPARTMENT_OTHER): Payer: Self-pay | Admitting: *Deleted

## 2022-01-13 NOTE — Telephone Encounter (Signed)
Post ED Visit - Positive Culture Follow-up: Unsuccessful Patient Follow-up ? ?Culture assessed and recommendations reviewed by: ? ?'[x]'$  Lorelei Pont, Pharm.D. ?'[]'$  Heide Guile, Pharm.D., BCPS AQ-ID ?'[]'$  Parks Neptune, Pharm.D., BCPS ?'[]'$  Alycia Rossetti, Pharm.D., BCPS ?'[]'$  [place], Pharm.D., BCPS, AAHIVP ?'[]'$  Legrand Como, Pharm.D., BCPS, AAHIVP ?'[]'$  Wynell Balloon, PharmD ?'[]'$  Vincenza Hews, PharmD, BCPS ? ?Positive urine culture ? ?'[x]'$  Patient discharged without antimicrobial prescription and treatment is now indicated if symptomatic ?'[]'$  Organism is resistant to prescribed ED discharge antimicrobial ?'[]'$  Patient with positive blood cultures ? ? ?Unable to contact patient, letter will be sent to address on file ? ?Ariana Carney ?01/13/2022, 4:10 PM  ?

## 2022-01-13 NOTE — Telephone Encounter (Signed)
Post ED Visit - Positive Culture Follow-up: Successful Patient Follow-Up ? ?Culture assessed and recommendations reviewed by: ? ?'[]'$  Elenor Quinones, Pharm.D. ?'[]'$  Heide Guile, Pharm.D., BCPS AQ-ID ?'[]'$  Parks Neptune, Pharm.D., BCPS ?'[]'$  Alycia Rossetti, Pharm.D., BCPS ?'[]'$  [place], Pharm.D., BCPS, AAHIVP ?'[]'$  Legrand Como, Pharm.D., BCPS, AAHIVP ?'[]'$  Salome Arnt, PharmD, BCPS ?'[]'$  Johnnette Gourd, PharmD, BCPS ?'[]'$  Hughes Better, PharmD, BCPS ?'[x]'$  Lorelei Pont, PharmD ? ?Positive urine culture ? ?'[x]'$  Patient discharged without antimicrobial prescription and treatment is now indicated ?'[]'$  Organism is resistant to prescribed ED discharge antimicrobial ?'[]'$  Patient with positive blood cultures ? ?Changes discussed with ED provider: Lavenia Atlas, MD ?New antibiotic prescription Keflex 500 mg q 12 h for five days ?Called to [facility] ([place], [place]) [phone] ? ?Contacted patient, date 01/13/22, time 1700 ? ? ?Ariana Carney ?01/13/2022, 9:33 PM ? ?  ?

## 2022-01-13 NOTE — Progress Notes (Signed)
ED Antimicrobial Stewardship Positive Culture Follow Up  ? ?Ariana Carney is an 88 y.o. female who presented to [facility] on 01/09/2022 with a chief complaint of  ?Chief Complaint  ?Patient presents with  ? Fall  ? ? ?Recent Results (from the past 720 hour(s))  ?Urine Culture     Status: Abnormal  ? Collection Time: 01/09/22  9:32 AM  ? Specimen: Urine, Clean Catch  ?Result Value Ref Range Status  ? Specimen Description   Final  ?  URINE, CLEAN CATCH ?Performed at [facility], [address] [place], [place] [address] ?  ? Special Requests   Final  ?  NONE ?Performed at [facility], [address], [place], [place] [address] ?  ? Culture >=100,000 COLONIES/mL ESCHERICHIA COLI (A)  Final  ? Report Status 01/12/2022 FINAL  Final  ? Organism ID, Bacteria ESCHERICHIA COLI (A)  Final  ?    Susceptibility  ? Escherichia coli - MIC*  ?  AMPICILLIN >=32 RESISTANT Resistant   ?  CEFAZOLIN >=64 RESISTANT Resistant   ?  CEFEPIME <=0.12 SENSITIVE Sensitive   ?  CEFTRIAXONE 1 SENSITIVE Sensitive   ?  CIPROFLOXACIN <=0.25 SENSITIVE Sensitive   ?  GENTAMICIN <=1 SENSITIVE Sensitive   ?  IMIPENEM <=0.25 SENSITIVE Sensitive   ?  NITROFURANTOIN <=16 SENSITIVE Sensitive   ?  TRIMETH/SULFA <=20 SENSITIVE Sensitive   ?  AMPICILLIN/SULBACTAM >=32 RESISTANT Resistant   ?  PIP/TAZO 16 SENSITIVE Sensitive   ?  * >=100,000 COLONIES/mL ESCHERICHIA COLI  ? ? ?'[x]'$  Patient presenting to ED after a fall. Chart review reveals no specific urinary symptoms. As such, urine culture likely represents asymptomatic bacteriuria, for which antibiotics are not indicated. ? ?EDP [place] recommends calling patient to assess for any specific s/s of UTI (dysuria, urinary frequency and urgency, suprapubic pain, hematuria, chills, rigors, marked fatigue, flank pain, costovertebral angle tenderness, and nausea/vomiting). ? ?If present we can call in cephalexin 500 mg every 12 hours for 5 days (qty 10; refills 0) ? ?ED Provider: Fulton Reek ? ? ?Lorelei Pont,  PharmD, BCPS ?01/13/2022 11:53 AM ?ED Clinical Pharmacist -  [phone] ? ? ?

## 2022-01-16 DIAGNOSIS — N390 Urinary tract infection, site not specified: Secondary | ICD-10-CM | POA: Diagnosis not present

## 2022-01-16 DIAGNOSIS — B0222 Postherpetic trigeminal neuralgia: Secondary | ICD-10-CM | POA: Diagnosis not present

## 2022-01-16 DIAGNOSIS — N1831 Chronic kidney disease, stage 3a: Secondary | ICD-10-CM | POA: Diagnosis not present

## 2022-01-16 DIAGNOSIS — M9918 Subluxation complex (vertebral) of rib cage: Secondary | ICD-10-CM | POA: Diagnosis not present

## 2022-03-27 ENCOUNTER — Other Ambulatory Visit: Payer: Self-pay | Admitting: Internal Medicine

## 2022-03-27 DIAGNOSIS — Z1231 Encounter for screening mammogram for malignant neoplasm of breast: Secondary | ICD-10-CM

## 2022-04-03 ENCOUNTER — Ambulatory Visit
Admission: RE | Admit: 2022-04-03 | Discharge: 2022-04-03 | Disposition: A | Discharge status: Home / Self Care | Payer: Medicare Other | Source: Ambulatory Visit | Attending: Internal Medicine | Admitting: Internal Medicine

## 2022-04-03 ENCOUNTER — Emergency Department (HOSPITAL_COMMUNITY): Payer: Medicare Other

## 2022-04-03 ENCOUNTER — Other Ambulatory Visit: Payer: Self-pay

## 2022-04-03 ENCOUNTER — Emergency Department (HOSPITAL_COMMUNITY)
Admission: EM | Admit: 2022-04-03 | Discharge: 2022-04-03 | Disposition: A | Discharge status: Home / Self Care | Payer: Medicare Other | Attending: Emergency Medicine | Admitting: Emergency Medicine

## 2022-04-03 ENCOUNTER — Ambulatory Visit (INDEPENDENT_AMBULATORY_CARE_PROVIDER_SITE_OTHER): Payer: Medicare Other | Admitting: Podiatry

## 2022-04-03 ENCOUNTER — Encounter (HOSPITAL_COMMUNITY): Payer: Self-pay | Admitting: *Deleted

## 2022-04-03 DIAGNOSIS — M79675 Pain in left toe(s): Secondary | ICD-10-CM | POA: Diagnosis not present

## 2022-04-03 DIAGNOSIS — Z1231 Encounter for screening mammogram for malignant neoplasm of breast: Secondary | ICD-10-CM

## 2022-04-03 DIAGNOSIS — M79674 Pain in right toe(s): Secondary | ICD-10-CM | POA: Diagnosis not present

## 2022-04-03 DIAGNOSIS — R531 Weakness: Secondary | ICD-10-CM | POA: Diagnosis not present

## 2022-04-03 DIAGNOSIS — N390 Urinary tract infection, site not specified: Secondary | ICD-10-CM | POA: Diagnosis not present

## 2022-04-03 DIAGNOSIS — R059 Cough, unspecified: Secondary | ICD-10-CM | POA: Diagnosis not present

## 2022-04-03 DIAGNOSIS — B351 Tinea unguium: Secondary | ICD-10-CM | POA: Diagnosis not present

## 2022-04-03 DIAGNOSIS — L84 Corns and callosities: Secondary | ICD-10-CM | POA: Diagnosis not present

## 2022-04-03 DIAGNOSIS — G629 Polyneuropathy, unspecified: Secondary | ICD-10-CM | POA: Diagnosis not present

## 2022-04-03 DIAGNOSIS — I10 Essential (primary) hypertension: Secondary | ICD-10-CM | POA: Diagnosis not present

## 2022-04-03 DIAGNOSIS — E538 Deficiency of other specified B group vitamins: Secondary | ICD-10-CM | POA: Diagnosis not present

## 2022-04-03 LAB — URINALYSIS, ROUTINE W REFLEX MICROSCOPIC
Bilirubin Urine: NEGATIVE
Glucose, UA: NEGATIVE mg/dL
Ketones, ur: NEGATIVE mg/dL
Nitrite: POSITIVE — AB
Protein, ur: 30 mg/dL — AB
RBC / HPF: >50 RBC/hpf — ABNORMAL HIGH (ref 0–5)
Specific Gravity, Urine: 1.013 (ref 1.005–1.030)
WBC, UA: >50 WBC/hpf — ABNORMAL HIGH (ref 0–5)
pH: 7 (ref 5.0–8.0)

## 2022-04-03 LAB — CBC
HCT: 32.1 % — ABNORMAL LOW (ref 36.0–46.0)
Hemoglobin: 10.7 g/dL — ABNORMAL LOW (ref 12.0–15.0)
MCH: 28.8 pg (ref 26.0–34.0)
MCHC: 33.3 g/dL (ref 30.0–36.0)
MCV: 86.3 fL (ref 80.0–100.0)
Platelets: 250 10*3/uL (ref 150–400)
RBC: 3.72 MIL/uL — ABNORMAL LOW (ref 3.87–5.11)
RDW: 14.2 % (ref 11.5–15.5)
WBC: 5.1 10*3/uL (ref 4.0–10.5)
nRBC: 0 % (ref 0.0–0.2)

## 2022-04-03 LAB — BASIC METABOLIC PANEL
Anion gap: 7 (ref 5–15)
BUN: 17 mg/dL (ref 8–23)
CO2: 25 mmol/L (ref 22–32)
Calcium: 8.6 mg/dL — ABNORMAL LOW (ref 8.9–10.3)
Chloride: 95 mmol/L — ABNORMAL LOW (ref 98–111)
Creatinine, Ser: 1.26 mg/dL — ABNORMAL HIGH (ref 0.44–1.00)
GFR, Estimated: 38 mL/min — ABNORMAL LOW (ref >60)
Glucose, Bld: 126 mg/dL — ABNORMAL HIGH (ref 70–99)
Potassium: 3.7 mmol/L (ref 3.5–5.1)
Sodium: 127 mmol/L — ABNORMAL LOW (ref 135–145)

## 2022-04-03 LAB — TROPONIN I (HIGH SENSITIVITY): Troponin I (High Sensitivity): 22 ng/L — ABNORMAL HIGH (ref <18)

## 2022-04-03 MED ORDER — CEPHALEXIN 500 MG PO CAPS
500.0000 mg | ORAL_CAPSULE | Freq: Once | ORAL | Status: AC
  Administered 2022-04-03: 500 mg via ORAL
  Filled 2022-04-03: qty 1

## 2022-04-03 MED ORDER — CEPHALEXIN 500 MG PO CAPS: 500.0000 mg | ORAL_CAPSULE | Freq: Two times a day (BID) | ORAL | 0 refills | Status: DC

## 2022-04-03 NOTE — ED Provider Notes (Signed)
[facility] Provider Note   CSN: [id number] Arrival date & time: 04/03/22  1939     History {Add pertinent medical, surgical, social history, OB history to HPI:1} Chief Complaint  Patient presents with   Altered Mental Status    Ariana Carney is a 88 y.o. female presenting from home in the company of her daughters with generalized weakness.  The patient was in her normal state of health today and the family was driving into [place] to get her nails done.  On the way home she appeared to "doze off or drip off in the car".  When they got home the patient was too weak to get out of the car and had to be carried out.  Typically she is ambulatory on her own, lives with her daughter.  Her daughter reports the patient has not really drink water, generally only Pepsi, and candy, does not eat much food to either.  Since arrival in the ER both of the patient's daughters at bedside feel that she is mentating normally and appears back to normal.  The patient herself remembers everything that happened today, denied that she had any chest pain or pressure, does not know why she felt so weak all of a sudden  HPI     Home Medications Prior to Admission medications   Medication Sig Start Date End Date Taking? Authorizing Provider  digoxin (LANOXIN) 0.125 MG tablet Take 125 mcg by mouth daily.     Yes [provider]  famotidine (PEPCID) 20 MG tablet Take 1 tablet (20 mg total) by mouth 2 (two) times daily for 5 days. 05/20/21 04/03/22 Yes Wurst, [place], PA-C  gabapentin (NEURONTIN) 300 MG capsule Take 100 mg by mouth 3 (three) times daily.   Yes [provider]  levothyroxine (SYNTHROID, LEVOTHROID) 50 MCG tablet Take 50 mcg by mouth daily.     Yes [provider]  lisinopril-hydrochlorothiazide (PRINZIDE,ZESTORETIC) 10-12.5 MG per tablet Take 1 tablet by mouth daily.   Yes [provider]  valACYclovir (VALTREX) 1000 MG tablet Take 1  tablet (1,000 mg total) by mouth 3 (three) times daily. 09/27/21  Yes Volney American, PA-C      Allergies    Patient has no known allergies.    Review of Systems   Review of Systems  Physical Exam Updated Vital Signs BP (!) 101/59   Pulse 73   Temp 98.5 F (36.9 C)   Resp 20   SpO2 98%  Physical Exam Constitutional:      General: She is not in acute distress. HENT:     Head: Normocephalic and atraumatic.  Eyes:     Conjunctiva/sclera: Conjunctivae normal.     Pupils: Pupils are equal, round, and reactive to light.  Cardiovascular:     Rate and Rhythm: Normal rate and regular rhythm.  Pulmonary:     Effort: Pulmonary effort is normal. No respiratory distress.  Abdominal:     General: There is no distension.     Tenderness: There is no abdominal tenderness.  Skin:    General: Skin is warm and dry.  Neurological:     General: No focal deficit present.     Mental Status: She is alert. Mental status is at baseline.  Psychiatric:        Mood and Affect: Mood normal.        Behavior: Behavior normal.     ED Results / Procedures / Treatments   Labs (all labs ordered are listed,  but only abnormal results are displayed) Labs Reviewed  BASIC METABOLIC PANEL - Abnormal; Notable for the following components:      Result Value   Sodium 127 (*)    Chloride 95 (*)    Glucose, Bld 126 (*)    Creatinine, Ser 1.26 (*)    Calcium 8.6 (*)    GFR, Estimated 38 (*)    All other components within normal limits  CBC - Abnormal; Notable for the following components:   RBC 3.72 (*)    Hemoglobin 10.7 (*)    HCT 32.1 (*)    All other components within normal limits  URINALYSIS, ROUTINE W REFLEX MICROSCOPIC    EKG None  Radiology DG Chest 2 View  Result Date: 04/03/2022 CLINICAL DATA:  Cough. EXAM: CHEST - 2 VIEW COMPARISON:  Chest radiograph dated 10/18/2021. FINDINGS: No focal consolidation, pleural effusion, or pneumothorax. The cardiac silhouette is within limits.  Atherosclerotic calcification of the aorta. Osteopenia degenerative changes of the spine. Right chest wall surgical clips. No acute osseous pathology. IMPRESSION: No active cardiopulmonary disease. Electronically Signed   By: Anner Crete M.D.   On: 04/03/2022 20:44    Procedures Procedures  {Document cardiac monitor, telemetry assessment procedure when appropriate:1}  Medications Ordered in ED Medications - No data to display  ED Course/ Medical Decision Making/ A&P                           Medical Decision Making Amount and/or Complexity of Data Reviewed Labs: ordered. Radiology: ordered.   This patient presents to the ED with concern for ***. This involves an extensive number of treatment options, and is a complaint that carries with it a high risk of complications and morbidity.  The differential diagnosis includes ***  Co-morbidities that complicate the patient evaluation: ***  Additional history obtained from ***  External records from outside source obtained and reviewed including ***  I ordered and personally interpreted labs.  The pertinent results include:  ***  I ordered imaging studies including *** I independently visualized and interpreted imaging which showed *** I agree with the radiologist interpretation  The patient was maintained on a cardiac monitor.  I personally viewed and interpreted the cardiac monitored which showed an underlying rhythm of: ***  Per my interpretation the patient's ECG shows ***  I ordered medication including ***  for *** I have reviewed the patients home medicines and have made adjustments as needed  Test Considered: ***  I requested consultation with the ***,  and discussed lab and imaging findings as well as pertinent plan - they recommend: ***  After the interventions noted above, I reevaluated the patient and found that they have: {resolved/improved/worsened:23923::"improved"}  Social Determinants of  Health:***  Dispostion:  After consideration of the diagnostic results and the patients response to treatment, I feel that the patent would benefit from ***.   {Document critical care time when appropriate:1} {Document review of labs and clinical decision tools ie heart score, Chads2Vasc2 etc:1}  {Document your independent review of radiology images, and any outside records:1} {Document your discussion with family members, caretakers, and with consultants:1} {Document social determinants of health affecting pt's care:1} {Document your decision making why or why not admission, treatments were needed:1} Final Clinical Impression(s) / ED Diagnoses Final diagnoses:  None    Rx / DC Orders ED Discharge Orders     None

## 2022-04-03 NOTE — ED Triage Notes (Signed)
Pt daughter says that today the pt has "not been acting herself, when we got home, it was like she could not get out the car and walk up to the house herself. She seems weak and short of breath".  Daughter says they took her blood pressure at home and it was in the 80's and that she has been coughing at night. States she seems to be more alert now.

## 2022-04-04 ENCOUNTER — Other Ambulatory Visit: Payer: Self-pay

## 2022-04-04 ENCOUNTER — Observation Stay (HOSPITAL_COMMUNITY)
Admission: EM | Admit: 2022-04-04 | Discharge: 2022-04-05 | Disposition: A | Discharge status: Home / Self Care | Payer: Medicare Other | Attending: Internal Medicine | Admitting: Internal Medicine

## 2022-04-04 ENCOUNTER — Observation Stay (HOSPITAL_BASED_OUTPATIENT_CLINIC_OR_DEPARTMENT_OTHER): Payer: Medicare Other

## 2022-04-04 ENCOUNTER — Encounter (HOSPITAL_COMMUNITY): Payer: Self-pay | Admitting: Emergency Medicine

## 2022-04-04 DIAGNOSIS — F17210 Nicotine dependence, cigarettes, uncomplicated: Secondary | ICD-10-CM | POA: Diagnosis not present

## 2022-04-04 DIAGNOSIS — K219 Gastro-esophageal reflux disease without esophagitis: Secondary | ICD-10-CM | POA: Diagnosis not present

## 2022-04-04 DIAGNOSIS — R778 Other specified abnormalities of plasma proteins: Principal | ICD-10-CM | POA: Insufficient documentation

## 2022-04-04 DIAGNOSIS — N1831 Chronic kidney disease, stage 3a: Secondary | ICD-10-CM | POA: Diagnosis not present

## 2022-04-04 DIAGNOSIS — E871 Hypo-osmolality and hyponatremia: Secondary | ICD-10-CM | POA: Diagnosis not present

## 2022-04-04 DIAGNOSIS — I129 Hypertensive chronic kidney disease with stage 1 through stage 4 chronic kidney disease, or unspecified chronic kidney disease: Secondary | ICD-10-CM | POA: Insufficient documentation

## 2022-04-04 DIAGNOSIS — I361 Nonrheumatic tricuspid (valve) insufficiency: Secondary | ICD-10-CM

## 2022-04-04 DIAGNOSIS — Z720 Tobacco use: Secondary | ICD-10-CM

## 2022-04-04 DIAGNOSIS — Z853 Personal history of malignant neoplasm of breast: Secondary | ICD-10-CM | POA: Diagnosis not present

## 2022-04-04 DIAGNOSIS — E039 Hypothyroidism, unspecified: Secondary | ICD-10-CM

## 2022-04-04 DIAGNOSIS — R4182 Altered mental status, unspecified: Secondary | ICD-10-CM | POA: Diagnosis present

## 2022-04-04 DIAGNOSIS — Z79899 Other long term (current) drug therapy: Secondary | ICD-10-CM | POA: Insufficient documentation

## 2022-04-04 DIAGNOSIS — N3000 Acute cystitis without hematuria: Secondary | ICD-10-CM

## 2022-04-04 DIAGNOSIS — N179 Acute kidney failure, unspecified: Secondary | ICD-10-CM

## 2022-04-04 DIAGNOSIS — G9341 Metabolic encephalopathy: Secondary | ICD-10-CM

## 2022-04-04 DIAGNOSIS — B0222 Postherpetic trigeminal neuralgia: Secondary | ICD-10-CM | POA: Diagnosis not present

## 2022-04-04 DIAGNOSIS — F1120 Opioid dependence, uncomplicated: Secondary | ICD-10-CM | POA: Diagnosis not present

## 2022-04-04 DIAGNOSIS — R531 Weakness: Secondary | ICD-10-CM

## 2022-04-04 DIAGNOSIS — I10 Essential (primary) hypertension: Secondary | ICD-10-CM | POA: Diagnosis not present

## 2022-04-04 DIAGNOSIS — E876 Hypokalemia: Secondary | ICD-10-CM | POA: Diagnosis not present

## 2022-04-04 DIAGNOSIS — N390 Urinary tract infection, site not specified: Secondary | ICD-10-CM | POA: Diagnosis not present

## 2022-04-04 DIAGNOSIS — R7989 Other specified abnormal findings of blood chemistry: Secondary | ICD-10-CM | POA: Diagnosis present

## 2022-04-04 LAB — COMPREHENSIVE METABOLIC PANEL
ALT: 7 U/L (ref 0–44)
AST: 14 U/L — ABNORMAL LOW (ref 15–41)
Albumin: 3 g/dL — ABNORMAL LOW (ref 3.5–5.0)
Alkaline Phosphatase: 52 U/L (ref 38–126)
Anion gap: 7 (ref 5–15)
BUN: 20 mg/dL (ref 8–23)
CO2: 23 mmol/L (ref 22–32)
Calcium: 8 mg/dL — ABNORMAL LOW (ref 8.9–10.3)
Chloride: 93 mmol/L — ABNORMAL LOW (ref 98–111)
Creatinine, Ser: 1.1 mg/dL — ABNORMAL HIGH (ref 0.44–1.00)
GFR, Estimated: 45 mL/min — ABNORMAL LOW (ref >60)
Glucose, Bld: 70 mg/dL (ref 70–99)
Potassium: 3.2 mmol/L — ABNORMAL LOW (ref 3.5–5.1)
Sodium: 123 mmol/L — ABNORMAL LOW (ref 135–145)
Total Bilirubin: 0.6 mg/dL (ref 0.3–1.2)
Total Protein: 5.6 g/dL — ABNORMAL LOW (ref 6.5–8.1)

## 2022-04-04 LAB — SODIUM, URINE, RANDOM: Sodium, Ur: 32 mmol/L

## 2022-04-04 LAB — ECHOCARDIOGRAM COMPLETE
AR max vel: 2.25 cm2
AV Area VTI: 1.79 cm2
AV Area mean vel: 1.67 cm2
AV Mean grad: 5 mmHg
AV Peak grad: 8.9 mmHg
Ao pk vel: 1.49 m/s
Area-P 1/2: 3.23 cm2
Height: 61 in
MV VTI: 1.67 cm2
S' Lateral: 2.1 cm
Weight: 1915.2 oz

## 2022-04-04 LAB — CBC WITH DIFFERENTIAL/PLATELET
Abs Immature Granulocytes: 0.03 10*3/uL (ref 0.00–0.07)
Basophils Absolute: 0 10*3/uL (ref 0.0–0.1)
Basophils Relative: 1 %
Eosinophils Absolute: 0.2 10*3/uL (ref 0.0–0.5)
Eosinophils Relative: 3 %
HCT: 28.7 % — ABNORMAL LOW (ref 36.0–46.0)
Hemoglobin: 9.8 g/dL — ABNORMAL LOW (ref 12.0–15.0)
Immature Granulocytes: 1 %
Lymphocytes Relative: 29 %
Lymphs Abs: 1.8 10*3/uL (ref 0.7–4.0)
MCH: 29.1 pg (ref 26.0–34.0)
MCHC: 34.1 g/dL (ref 30.0–36.0)
MCV: 85.2 fL (ref 80.0–100.0)
Monocytes Absolute: 0.8 10*3/uL (ref 0.1–1.0)
Monocytes Relative: 13 %
Neutro Abs: 3.5 10*3/uL (ref 1.7–7.7)
Neutrophils Relative %: 53 %
Platelets: 246 10*3/uL (ref 150–400)
RBC: 3.37 MIL/uL — ABNORMAL LOW (ref 3.87–5.11)
RDW: 13.8 % (ref 11.5–15.5)
WBC: 6.4 10*3/uL (ref 4.0–10.5)
nRBC: 0 % (ref 0.0–0.2)

## 2022-04-04 LAB — MAGNESIUM: Magnesium: 1.7 mg/dL (ref 1.7–2.4)

## 2022-04-04 LAB — FOLATE: Folate: 3.8 ng/mL — ABNORMAL LOW (ref >5.9)

## 2022-04-04 LAB — VITAMIN B12: Vitamin B-12: 201 pg/mL (ref 180–914)

## 2022-04-04 LAB — TROPONIN I (HIGH SENSITIVITY)
Troponin I (High Sensitivity): 163 ng/L (ref <18)
Troponin I (High Sensitivity): 183 ng/L (ref <18)
Troponin I (High Sensitivity): 138 ng/L (ref <18)

## 2022-04-04 LAB — OSMOLALITY: Osmolality: 254 mOsm/kg — ABNORMAL LOW (ref 275–295)

## 2022-04-04 LAB — CREATININE, URINE, RANDOM: Creatinine, Urine: 52.22 mg/dL

## 2022-04-04 LAB — DIGOXIN LEVEL: Digoxin Level: 0.5 ng/mL — ABNORMAL LOW (ref 0.8–2.0)

## 2022-04-04 LAB — OSMOLALITY, URINE: Osmolality, Ur: 248 mOsm/kg — ABNORMAL LOW (ref 300–900)

## 2022-04-04 LAB — TSH: TSH: 1.541 u[IU]/mL (ref 0.350–4.500)

## 2022-04-04 MED ORDER — MORPHINE SULFATE (PF) 2 MG/ML IV SOLN: 2.0000 mg | INTRAVENOUS | Status: DC | PRN

## 2022-04-04 MED ORDER — LEVOTHYROXINE SODIUM 50 MCG PO TABS
50.0000 ug | ORAL_TABLET | Freq: Every day | ORAL | Status: DC
  Administered 2022-04-04 – 2022-04-05 (×2): 50 ug via ORAL
  Filled 2022-04-04: qty 1

## 2022-04-04 MED ORDER — POTASSIUM CHLORIDE CRYS ER 20 MEQ PO TBCR: 40.0000 meq | EXTENDED_RELEASE_TABLET | Freq: Once | ORAL | Status: DC

## 2022-04-04 MED ORDER — CYANOCOBALAMIN 1000 MCG/ML IJ SOLN
1000.0000 ug | Freq: Once | INTRAMUSCULAR | Status: AC
  Administered 2022-04-04: 1000 ug via INTRAMUSCULAR
  Filled 2022-04-04: qty 1

## 2022-04-04 MED ORDER — ONDANSETRON HCL 4 MG/2ML IJ SOLN: 4.0000 mg | Freq: Four times a day (QID) | INTRAMUSCULAR | Status: DC | PRN

## 2022-04-04 MED ORDER — ACETAMINOPHEN 325 MG PO TABS
650.0000 mg | ORAL_TABLET | Freq: Four times a day (QID) | ORAL | Status: DC | PRN
  Administered 2022-04-04 – 2022-04-05 (×2): 650 mg via ORAL
  Filled 2022-04-04 (×2): qty 2

## 2022-04-04 MED ORDER — DIGOXIN 125 MCG PO TABS: 125.0000 ug | ORAL_TABLET | Freq: Every day | ORAL | Status: DC

## 2022-04-04 MED ORDER — GABAPENTIN 100 MG PO CAPS
100.0000 mg | ORAL_CAPSULE | Freq: Two times a day (BID) | ORAL | Status: DC
  Administered 2022-04-04 – 2022-04-05 (×3): 100 mg via ORAL
  Filled 2022-04-04 (×4): qty 1

## 2022-04-04 MED ORDER — VITAMIN B-12 100 MCG PO TABS
500.0000 ug | ORAL_TABLET | Freq: Every day | ORAL | Status: DC
  Administered 2022-04-05: 500 ug via ORAL
  Filled 2022-04-04: qty 5

## 2022-04-04 MED ORDER — FOLIC ACID 1 MG PO TABS
1.0000 mg | ORAL_TABLET | Freq: Every day | ORAL | Status: DC
  Administered 2022-04-04 – 2022-04-05 (×2): 1 mg via ORAL
  Filled 2022-04-04 (×2): qty 1

## 2022-04-04 MED ORDER — SODIUM CHLORIDE 0.9 % IV SOLN
1.0000 g | INTRAVENOUS | Status: DC
  Administered 2022-04-04 – 2022-04-05 (×2): 1 g via INTRAVENOUS
  Filled 2022-04-04 (×2): qty 10

## 2022-04-04 MED ORDER — FAMOTIDINE 20 MG PO TABS: 20.0000 mg | ORAL_TABLET | Freq: Two times a day (BID) | ORAL | Status: DC

## 2022-04-04 MED ORDER — OXYCODONE HCL 5 MG PO TABS
5.0000 mg | ORAL_TABLET | ORAL | Status: DC | PRN
  Administered 2022-04-04 (×2): 5 mg via ORAL
  Filled 2022-04-04 (×2): qty 1

## 2022-04-04 MED ORDER — ACETAMINOPHEN 650 MG RE SUPP: 650.0000 mg | Freq: Four times a day (QID) | RECTAL | Status: DC | PRN

## 2022-04-04 MED ORDER — SODIUM CHLORIDE 0.9 % IV SOLN: INTRAVENOUS | Status: DC

## 2022-04-04 MED ORDER — ONDANSETRON HCL 4 MG PO TABS: 4.0000 mg | ORAL_TABLET | Freq: Four times a day (QID) | ORAL | Status: DC | PRN

## 2022-04-04 MED ORDER — MAGNESIUM SULFATE 2 GM/50ML IV SOLN
2.0000 g | Freq: Once | INTRAVENOUS | Status: AC
  Administered 2022-04-04: 2 g via INTRAVENOUS
  Filled 2022-04-04: qty 50

## 2022-04-04 MED ORDER — HEPARIN SODIUM (PORCINE) 5000 UNIT/ML IJ SOLN
5000.0000 [IU] | Freq: Three times a day (TID) | INTRAMUSCULAR | Status: DC
  Administered 2022-04-04 – 2022-04-05 (×4): 5000 [IU] via SUBCUTANEOUS
  Filled 2022-04-04 (×4): qty 1

## 2022-04-04 MED ORDER — FAMOTIDINE 20 MG PO TABS
20.0000 mg | ORAL_TABLET | Freq: Every day | ORAL | Status: DC
  Administered 2022-04-04 – 2022-04-05 (×2): 20 mg via ORAL
  Filled 2022-04-04 (×2): qty 1

## 2022-04-04 NOTE — Assessment & Plan Note (Signed)
PDMP reviewed -Patient receiving Percocet 5/325 #30, last refill 12/20/2021 -Patient has been taking secondary to what appears to be postherpetic neuralgia from recent shingles

## 2022-04-04 NOTE — Assessment & Plan Note (Addendum)
Suspect volume depletion and poor solute intake -Serum osmolarity--254 -Urine osmolarity--248 -FeNa--0.6% -Continue isotonic saline for now>>improved to 131 D/c lisino/HCTZ--BP remains stable

## 2022-04-04 NOTE — Assessment & Plan Note (Addendum)
-   Described as a period Of looking dazed and tired but still awake and conversant -Family reports improvement--states the patient is at baseline in the ED -Multifactorial including low sodium, UTI, low B12 -improved, back to baseline at time of d/c

## 2022-04-04 NOTE — ED Notes (Signed)
Attempted to call pts daughters to notify that per EDP they need to return to ED due to elevated troponin, no answer - unable to leave voice message.

## 2022-04-04 NOTE — Assessment & Plan Note (Addendum)
-   UA >50 WBC -Rocephin given at first ER visit, with Keflex at discharge -Continue Rocephin, hold Keflex for now -Urine culture pending at time of dc -d/c with cefdinir x 3 more days

## 2022-04-04 NOTE — Assessment & Plan Note (Addendum)
Multifactorial including UTI, hyponatremia, deconditioning Y40--397 Folic XFFK--9.2 UA suggestive of UTI PT evaluation TSH 1.541

## 2022-04-04 NOTE — Assessment & Plan Note (Addendum)
-   TSH--1.541 -Continue Synthroid

## 2022-04-04 NOTE — Assessment & Plan Note (Signed)
Continue Pepcid  

## 2022-04-04 NOTE — Progress Notes (Signed)
PROGRESS NOTE  Ariana Carney QBH:[id number] DOB: 05/29/1920 DOA: 04/04/2022 PCP: [name], MD  Brief History:  88 year old female with a history of DCIS of the right breast, hypertension, hypothyroidism, tobacco abuse presenting with generalized weakness, altered mental status, and poor oral intake.  The patient had been in her usual state of health until the patient's family was bringing her back from [place] after getting her nails done.  Apparently, the patient appeared to doze off and drift off in the car.When they got home the patient was too weak to get out of the car and had to be carried out.  At baseline, the patient ambulates with a cane and walker at home.  Daughter denies any recent new medications.  The patient is a poor historian.  History is also supplemented by the patient's daughter.  Apparently the patient has had poor oral intake for the past 2 to 3 months.Her daughter reports the patient has not really drink water, generally only Pepsi, and candy, does not eat much food of what she considers of good nutritional value.  There is been no complaints of fevers, chills, chest pain, shortness breath, nausea, vomiting, diarrhea, abdominal pain.  Apparently the patient has been complaining of some dizziness. In the ED, the patient was initially diagnosed with a UTI.  She was given a dose of ceftriaxone and discharged home with cephalexin.  However she was called back in the ED when her troponin went up from 22 to 163. In the ED, the patient was afebrile and hemodynamically stable with oxygen saturation 98-99% room air.  WBC 6.4, hemoglobin 9.8, platelets 246,000.  Sodium 123, potassium 3.2, bicarbonate 23, serum creatinine 1.10.  Troponins 62>> 163>> 183>> 138.     Assessment and Plan: * Elevated troponin - Troponins 62>> 163>> 183>> 138. -No chest pain reported -Likely demand ischemia -Echocardiogram -Personally reviewed EKG--sinus rhythm, LBBB  Acute metabolic  encephalopathy - Described as a period Of looking dazed and tired but still awake and conversant -Family reports improvement--states the patient is at baseline in the ED -Multifactorial including low sodium, UTI  Hyponatremia Suspect volume depletion and poor solute intake -Serum osmolarity -Urine osmolarity -Urine sodium -Continue isotonic saline for now -Repeat BMP in the morning  UTI (urinary tract infection) - UA >50 WBC -Rocephin given at first ER visit, with Keflex at discharge -Continue Rocephin, hold Keflex for now -Urine culture pending  Generalized weakness Multifactorial including UTI, hyponatremia, deconditioning O97 Folic acid UA suggestive of UTI PT evaluation TSH 1.541  Tobacco abuse Smokes 5-10 cigarettes per day Cessation discussed  Opioid dependence ([place]) PDMP reviewed -Patient receiving Percocet 5/325 #30, last refill 12/20/2021 -Patient has been taking secondary to what appears to be postherpetic neuralgia from recent shingles  Chronic kidney disease, stage 3a ([place]) Baseline creatinine 0.9-1.1  Hypokalemia Replete -Check magnesium  Hypothyroidism - TSH--1.541 -Continue Synthroid  GERD (gastroesophageal reflux disease) - Continue Pepcid      Family Communication:   daughter updated at bedside 6/14  Consultants:  cardiology  Code Status:  FULL  DVT Prophylaxis:  [place] Heparin    Procedures: As Listed in Progress Note Above  Antibiotics: Ceftriaxone 6/13>>    Subjective:  Patient denies fevers, chills, headache, chest pain, dyspnea, nausea, vomiting, diarrhea, abdominal pain, dysuria, hematuria, hematochezia, and melena.  Objective: Vitals:   04/04/22 0300 04/04/22 0430 04/04/22 0500 04/04/22 0600  BP: 124/73 122/66 104/63 111/74  Pulse: 73 64 62 63  Resp:  $'18 15 14 13  'f$ Temp: 98.3 F (36.8 C)     TempSrc: Oral     SpO2: 98% 97% 96% 98%  Weight:      Height:       No intake or output data in the 24 hours ending 04/04/22  0721 Weight change:  Exam:  General:  Pt is alert, follows commands appropriately, not in acute distress HEENT: No icterus, No thrush, No neck mass, [place]/AT Cardiovascular: RRR, S1/S2, no rubs, no gallops Respiratory: diminished breath sounds. Bibasilar rales.  No wheeze Abdomen: Soft/+BS, non tender, non distended, no guarding Extremities: No edema, No lymphangitis, No petechiae, No rashes, no synovitis   Data Reviewed: I have personally reviewed following labs and imaging studies Basic Metabolic Panel: Recent Labs  Lab 04/03/22 2011 04/04/22 0430  NA 127* 123*  K 3.7 3.2*  CL 95* 93*  CO2 25 23  GLUCOSE 126* 70  BUN 17 20  CREATININE 1.26* 1.10*  CALCIUM 8.6* 8.0*  MG  --  1.7   Liver Function Tests: Recent Labs  Lab 04/04/22 0430  AST 14*  ALT 7  ALKPHOS 52  BILITOT 0.6  PROT 5.6*  ALBUMIN 3.0*   No results for input(s): "LIPASE", "AMYLASE" in the last 168 hours. No results for input(s): "AMMONIA" in the last 168 hours. Coagulation Profile: No results for input(s): "INR", "PROTIME" in the last 168 hours. CBC: Recent Labs  Lab 04/03/22 2011 04/04/22 0427  WBC 5.1 6.4  NEUTROABS  --  3.5  HGB 10.7* 9.8*  HCT 32.1* 28.7*  MCV 86.3 85.2  PLT 250 246   Cardiac Enzymes: No results for input(s): "CKTOTAL", "CKMB", "CKMBINDEX", "TROPONINI" in the last 168 hours. BNP: Invalid input(s): "POCBNP" CBG: No results for input(s): "GLUCAP" in the last 168 hours. HbA1C: No results for input(s): "HGBA1C" in the last 72 hours. Urine analysis:    Component Value Date/Time   COLORURINE AMBER (A) 04/03/2022 2213   APPEARANCEUR CLOUDY (A) 04/03/2022 2213   LABSPEC 1.013 04/03/2022 2213   PHURINE 7.0 04/03/2022 2213   GLUCOSEU NEGATIVE 04/03/2022 2213   HGBUR SMALL (A) 04/03/2022 2213   BILIRUBINUR NEGATIVE 04/03/2022 [place] 04/03/2022 2213   PROTEINUR 30 (A) 04/03/2022 2213   NITRITE POSITIVE (A) 04/03/2022 2213   LEUKOCYTESUR LARGE (A)  04/03/2022 2213   Sepsis Labs: '@LABRCNTIP'$ (procalcitonin:4,lacticidven:4) )No results found for this or any previous visit (from the past 240 hour(s)).   Scheduled Meds:  digoxin  125 mcg Oral Daily   famotidine  20 mg Oral BID   gabapentin  100 mg Oral BID   heparin  5,000 Units Subcutaneous Q8H   levothyroxine  50 mcg Oral Daily   Continuous Infusions:  sodium chloride 75 mL/hr at 04/04/22 0630   cefTRIAXone (ROCEPHIN)  IV     magnesium sulfate bolus IVPB      Procedures/Studies: DG Chest 2 View  Result Date: 04/03/2022 CLINICAL DATA:  Cough. EXAM: CHEST - 2 VIEW COMPARISON:  Chest radiograph dated 10/18/2021. FINDINGS: No focal consolidation, pleural effusion, or pneumothorax. The cardiac silhouette is within limits. Atherosclerotic calcification of the aorta. Osteopenia degenerative changes of the spine. Right chest wall surgical clips. No acute osseous pathology. IMPRESSION: No active cardiopulmonary disease. Electronically Signed   By: Anner Crete M.D.   On: 04/03/2022 20:44    Orson Eva, DO  [facility]  If 7PM-7AM, please contact night-coverage [email or web address] Password [email or web address] 04/04/2022, 7:21 AM   LOS: 0 days

## 2022-04-04 NOTE — TOC Progression Note (Signed)
Transition of Care [facility]) - Progression Note    Patient Details  Name: ILLIANNA PASCHAL MRN: [id number] Date of Birth: 01-31-1920  Transition of Care [facility]) CM/SW Contact  Salome Arnt, [place] Phone Number: 04/04/2022, 10:21 AM  Clinical Narrative:   Transition of Care [facility]) Screening Note   Patient Details  Name: KAYLINN DEDIC Date of Birth: Feb 28, 1920   Transition of Care [facility]) CM/SW Contact:    Salome Arnt, [place] Phone Number: 04/04/2022, 10:21 AM    Transition of Care Department [facility]) has reviewed patient and no TOC needs have been identified at this time. We will continue to monitor patient advancement through interdisciplinary progression rounds. If new patient transition needs arise, please place a TOC consult.         Barriers to Discharge: Continued Medical Work up  Expected Discharge Plan and Services                                                 Social Determinants of Health (SDOH) Interventions    Readmission Risk Interventions     No data to display

## 2022-04-04 NOTE — Assessment & Plan Note (Addendum)
-   Troponins 62>> 163>> 183>> 138. -No chest pain reported -Likely demand ischemia -Echo EF 60-65%, G1DD, trivial MR, no WMA -Personally reviewed EKG--sinus rhythm, LBBB

## 2022-04-04 NOTE — Consult Note (Signed)
Cardiology Consultation:   Patient ID: Ariana Carney MRN: [id number]; DOB: 1920-10-02  Admit date: 04/04/2022 Date of Consult: 04/04/2022  PCP:  [name], MD   [place] [facility] Cardiologist:  New to [facility] - Dr. Radford Pax   Patient Profile:   Ariana Carney is a 88 y.o. female with a hx of HTN, hypothyroidism and history of breast cancer who is being seen 04/04/2022 for the evaluation of elevated troponin values at the request of Dr. Carles Collet.  History of Present Illness:   Ariana Carney presented to [facility] ED on 04/03/2022 for evaluation of worsening weakness. In talking with the patient today, no family members are at the bedside and she is unable to elaborate on the symptoms that brought her into the hospital. She says that her daughter lives with her but she is able to perform ADL's independently. She has a cane at the bedside but says she only uses this as needed. She denies any recent chest pain, palpitations, dyspnea on exertion, orthopnea, PND or pitting edema. Unaware of any prior cardiac history. She is listed as having "heart disease" in her past medical history but no records are available for review. She was on Digoxin prior to admission. 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   Initial labs showed WBC 5.1, Hgb 10.7, platelets 250, +127, K+ 3.7 and creatinine 1.26. Initial Hs Troponin 22 with repeat values of 163, 183 and 138. TSH 1.541. Digoxin 0.5. Mg 1.7. UA suggestive of a UTI. CXR with no active cardiopulmonary disease.  EKG shows normal sinus rhythm, heart rate 75 with first-degree AV block and LBBB which was noted on prior tracings in 12/2021 as well.  She was started on IV fluids at the time of admission and has also been started on Rocephin for a likely UTI.  Repeat metabolic panel this morning shows her creatinine has improved to 1.10 but K+ is low at 3.2 and sodium is low at 123.   Past Medical History:  Diagnosis Date   Breast cancer ([place]) 2007    right   DCIS (ductal carcinoma in situ) of right breast 08/22/2011   Heart disease    Hypertension    Thyroid disease    Ulcer     Past Surgical History:  Procedure Laterality Date   ABDOMINAL HYSTERECTOMY     CHOLECYSTECTOMY     MASTECTOMY Left 10+ years ago     Home Medications:  Prior to Admission medications   Medication Sig Start Date End Date Taking? Authorizing Provider  cephALEXin (KEFLEX) 500 MG capsule Take 1 capsule (500 mg total) by mouth 2 (two) times daily for 5 days. 04/03/22 04/08/22  Wyvonnia Dusky, MD  digoxin (LANOXIN) 0.125 MG tablet Take 125 mcg by mouth daily.      [provider]  famotidine (PEPCID) 20 MG tablet Take 1 tablet (20 mg total) by mouth 2 (two) times daily for 5 days. 05/20/21 04/03/22  Wurst, Marye Round, PA-C  gabapentin (NEURONTIN) 300 MG capsule Take 100 mg by mouth 3 (three) times daily.    [provider]  levothyroxine (SYNTHROID, LEVOTHROID) 50 MCG tablet Take 50 mcg by mouth daily.      [provider]  lisinopril-hydrochlorothiazide (PRINZIDE,ZESTORETIC) 10-12.5 MG per tablet Take 1 tablet by mouth daily.    [provider]  valACYclovir (VALTREX) 1000 MG tablet Take 1 tablet (1,000 mg total) by mouth 3 (three) times daily. 09/27/21   Volney American, PA-C    Inpatient  Medications: Scheduled Meds:  digoxin  125 mcg Oral Daily   famotidine  20 mg Oral BID   gabapentin  100 mg Oral BID   heparin  5,000 Units Subcutaneous Q8H   levothyroxine  50 mcg Oral Daily   Continuous Infusions:  sodium chloride 75 mL/hr at 04/04/22 0630   cefTRIAXone (ROCEPHIN)  IV     magnesium sulfate bolus IVPB 2 g (04/04/22 0745)   PRN Meds: acetaminophen **OR** acetaminophen, morphine injection, ondansetron **OR** ondansetron (ZOFRAN) IV, oxyCODONE  Allergies:   No Known Allergies  Social History:   Social History   Socioeconomic History   Marital status: Widowed    Spouse name: Not on file   Number of  children: Not on file   Years of education: Not on file   Highest education level: Not on file  Occupational History   Not on file  Tobacco Use   Smoking status: Some Days    Types: Cigarettes   Smokeless tobacco: Never  Substance and Sexual Activity   Alcohol use: No   Drug use: No   Sexual activity: Not on file  Other Topics Concern   Not on file  Social History Narrative   Not on file   Social Determinants of Health   Financial Resource Strain: Not on file  Food Insecurity: Not on file  Transportation Needs: Not on file  Physical Activity: Not on file  Stress: Not on file  Social Connections: Not on file  Intimate Partner Violence: Not on file    Family History:   No family history on file. Patient unable to contribute and no family at the bedside.   ROS:  Please see the history of present illness.   All other ROS reviewed and negative.     Physical Exam/Data:   Vitals:   04/04/22 0300 04/04/22 0430 04/04/22 0500 04/04/22 0600  BP: 124/73 122/66 104/63 111/74  Pulse: 73 64 62 63  Resp: '18 15 14 13  '$ Temp: 98.3 F (36.8 C)     TempSrc: Oral     SpO2: 98% 97% 96% 98%  Weight:      Height:       No intake or output data in the 24 hours ending 04/04/22 0801    04/04/2022    2:58 AM 01/09/2022    9:05 AM 09/27/2021    7:29 PM  Last 3 Weights  Weight (lbs) 106 lb 14.8 oz 107 lb 107 lb  Weight (kg) 48.5 kg 48.535 kg 48.535 kg     Body mass index is 20.2 kg/m.  General: Pleasant elderly female appearing in no acute distress. HEENT: normal Neck: no JVD Vascular: No carotid bruits; Distal pulses 2+ bilaterally Cardiac:  normal S1, S2; RRR; no murmur Lungs:  clear to auscultation bilaterally, no wheezing, rhonchi or rales  Abd: soft, nontender, no hepatomegaly  Ext: no pitting edema Musculoskeletal:  No deformities, BUE and BLE strength normal and equal Skin: warm and dry  Neuro:  CNs 2-12 intact, no focal abnormalities noted Psych:  Normal affect   EKG:   The EKG was personally reviewed and demonstrates: NSR, heart rate 75 with first-degree AV block and LBBB which was noted on prior tracings in 12/2021 as well.  Telemetry:  Telemetry was personally reviewed and demonstrates: NSR, HR in 60's to 70's. Occasional skipped beats and PVC's.   Relevant CV Studies:  Echocardiogram: Pending  Laboratory Data:  High Sensitivity Troponin:   Recent Labs  Lab 04/03/22 2011 04/03/22 2230 04/04/22  0315 04/04/22 0430  TROPONINIHS 22* 163* 183* 138*     Chemistry Recent Labs  Lab 04/03/22 2011 04/04/22 0430  NA 127* 123*  K 3.7 3.2*  CL 95* 93*  CO2 25 23  GLUCOSE 126* 70  BUN 17 20  CREATININE 1.26* 1.10*  CALCIUM 8.6* 8.0*  MG  --  1.7  GFRNONAA 38* 45*  ANIONGAP 7 7    Recent Labs  Lab 04/04/22 0430  PROT 5.6*  ALBUMIN 3.0*  AST 14*  ALT 7  ALKPHOS 52  BILITOT 0.6   Lipids No results for input(s): "CHOL", "TRIG", "HDL", "LABVLDL", "LDLCALC", "CHOLHDL" in the last 168 hours.  Hematology Recent Labs  Lab 04/03/22 2011 04/04/22 0427  WBC 5.1 6.4  RBC 3.72* 3.37*  HGB 10.7* 9.8*  HCT 32.1* 28.7*  MCV 86.3 85.2  MCH 28.8 29.1  MCHC 33.3 34.1  RDW 14.2 13.8  PLT 250 246   Thyroid  Recent Labs  Lab 04/04/22 0427  TSH 1.541    BNPNo results for input(s): "BNP", "PROBNP" in the last 168 hours.  DDimer No results for input(s): "DDIMER" in the last 168 hours.   Radiology/Studies:  DG Chest 2 View  Result Date: 04/03/2022 CLINICAL DATA:  Cough. EXAM: CHEST - 2 VIEW COMPARISON:  Chest radiograph dated 10/18/2021. FINDINGS: No focal consolidation, pleural effusion, or pneumothorax. The cardiac silhouette is within limits. Atherosclerotic calcification of the aorta. Osteopenia degenerative changes of the spine. Right chest wall surgical clips. No acute osseous pathology. IMPRESSION: No active cardiopulmonary disease. Electronically Signed   By: Anner Crete M.D.   On: 04/03/2022 20:44     Assessment and Plan:    1. Elevated Troponin Values - Patient initially presented to the ED for evaluation of worsening weakness and decreased p.o. intake.  Troponin values were obtained and have overall been flat at 22, 163, 183 and 138. Her EKG does show a left bundle branch block which was noted on prior tracings in 12/2021 as well and is of unknown duration. - Given her advanced age and no recent anginal symptoms, would not anticipate further ischemic testing at this time. An echocardiogram has already been ordered and we will follow-up on the results of this. If she is found to have a cardiomyopathy, would anticipate medical management. She was on Digoxin prior to admission for unclear reasons and given her advanced age, would stop this for now.  2. HTN - BP has been variable since admission with SBP ranging from the 80's to 140's. BP at 111/74 on most recent check. Lisinopril-HCTZ has been held.   3. Hypothyroidism - TSH 1.541 this admission. She has been continued on Synthroid.   4. UTI/AKI/Hyponatremia - UA concerning for UTI (started on Ceftriaxone) and creatinine elevated to 1.26 on admission. Creatinine improving today but Na+ low at 123. Further management per the admitting team.    For questions or updates, please contact [place] Please consult [email or web address] for contact info under    Signed, Erma Heritage, PA-C  04/04/2022 8:01 AM

## 2022-04-04 NOTE — Assessment & Plan Note (Signed)
Smokes 5-10 cigarettes per day Cessation discussed

## 2022-04-04 NOTE — ED Notes (Signed)
Informed pt's daughter that she needed to bring her mother back to the ER due to increased troponin. She verbalized understanding and stated they would bring her back.

## 2022-04-04 NOTE — ED Triage Notes (Signed)
Pt was called back due to elevated troponins. Pt denies any complaints.

## 2022-04-04 NOTE — ED Provider Notes (Signed)
[facility] Provider Note   CSN: [id number] Arrival date & time: 04/04/22  0251     History  No chief complaint on file.   Ariana Carney is a 88 y.o. female.  Seen here earlier in the day for acute altered mental status. Dx w/ UTI. First set of labs were ok. Second troponin came back significantly more elevated but had gone home. Was called back. No symptoms since last visit. Sleeping well at home. No cp, sob.        Home Medications Prior to Admission medications   Medication Sig Start Date End Date Taking? Authorizing Provider  cephALEXin (KEFLEX) 500 MG capsule Take 1 capsule (500 mg total) by mouth 2 (two) times daily for 5 days. 04/03/22 04/08/22  Wyvonnia Dusky, MD  digoxin (LANOXIN) 0.125 MG tablet Take 125 mcg by mouth daily.      [provider]  famotidine (PEPCID) 20 MG tablet Take 1 tablet (20 mg total) by mouth 2 (two) times daily for 5 days. 05/20/21 04/03/22  Wurst, Marye Round, PA-C  gabapentin (NEURONTIN) 300 MG capsule Take 100 mg by mouth 3 (three) times daily.    [provider]  levothyroxine (SYNTHROID, LEVOTHROID) 50 MCG tablet Take 50 mcg by mouth daily.      [provider]  lisinopril-hydrochlorothiazide (PRINZIDE,ZESTORETIC) 10-12.5 MG per tablet Take 1 tablet by mouth daily.    [provider]  valACYclovir (VALTREX) 1000 MG tablet Take 1 tablet (1,000 mg total) by mouth 3 (three) times daily. 09/27/21   Volney American, PA-C      Allergies    Patient has no known allergies.    Review of Systems   Review of Systems  Physical Exam Updated Vital Signs BP 124/73   Pulse 73   Temp 98.3 F (36.8 C) (Oral)   Resp 18   Ht '5\' 1"'$  (1.549 m)   Wt 48.5 kg   SpO2 98%   BMI 20.20 kg/m  Physical Exam Vitals and nursing note reviewed.  Constitutional:      Appearance: She is well-developed.  HENT:     Head: Normocephalic and atraumatic.     Mouth/Throat:     Mouth: Mucous membranes  are moist.     Pharynx: Oropharynx is clear.  Eyes:     Pupils: Pupils are equal, round, and reactive to light.  Cardiovascular:     Rate and Rhythm: Normal rate and regular rhythm.  Pulmonary:     Effort: No respiratory distress.     Breath sounds: No stridor.  Abdominal:     General: Abdomen is flat. There is no distension.  Musculoskeletal:        General: No swelling or tenderness. Normal range of motion.     Cervical back: Normal range of motion.  Skin:    General: Skin is warm and dry.  Neurological:     General: No focal deficit present.     Mental Status: She is alert.     ED Results / Procedures / Treatments   Labs (all labs ordered are listed, but only abnormal results are displayed) Labs Reviewed  DIGOXIN LEVEL - Abnormal; Notable for the following components:      Result Value   Digoxin Level 0.5 (*)    All other components within normal limits  COMPREHENSIVE METABOLIC PANEL - Abnormal; Notable for the following components:   Sodium 123 (*)    Potassium 3.2 (*)    Chloride 93 (*)  Creatinine, Ser 1.10 (*)    Calcium 8.0 (*)    Total Protein 5.6 (*)    Albumin 3.0 (*)    AST 14 (*)    GFR, Estimated 45 (*)    All other components within normal limits  CBC WITH DIFFERENTIAL/PLATELET - Abnormal; Notable for the following components:   RBC 3.37 (*)    Hemoglobin 9.8 (*)    HCT 28.7 (*)    All other components within normal limits  TROPONIN I (HIGH SENSITIVITY) - Abnormal; Notable for the following components:   Troponin I (High Sensitivity) 183 (*)    All other components within normal limits  TROPONIN I (HIGH SENSITIVITY) - Abnormal; Notable for the following components:   Troponin I (High Sensitivity) 138 (*)    All other components within normal limits  MAGNESIUM  TSH    EKG None  Radiology DG Chest 2 View  Result Date: 04/03/2022 CLINICAL DATA:  Cough. EXAM: CHEST - 2 VIEW COMPARISON:  Chest radiograph dated 10/18/2021. FINDINGS: No focal  consolidation, pleural effusion, or pneumothorax. The cardiac silhouette is within limits. Atherosclerotic calcification of the aorta. Osteopenia degenerative changes of the spine. Right chest wall surgical clips. No acute osseous pathology. IMPRESSION: No active cardiopulmonary disease. Electronically Signed   By: Anner Crete M.D.   On: 04/03/2022 20:44    Procedures Procedures    Medications Ordered in ED Medications  digoxin (LANOXIN) tablet 125 mcg (has no administration in time range)  levothyroxine (SYNTHROID) tablet 50 mcg (has no administration in time range)  famotidine (PEPCID) tablet 20 mg (has no administration in time range)  gabapentin (NEURONTIN) capsule 100 mg (has no administration in time range)  heparin injection 5,000 Units (has no administration in time range)  acetaminophen (TYLENOL) tablet 650 mg (has no administration in time range)    Or  acetaminophen (TYLENOL) suppository 650 mg (has no administration in time range)  0.9 %  sodium chloride infusion (has no administration in time range)  oxyCODONE (Oxy IR/ROXICODONE) immediate release tablet 5 mg (has no administration in time range)  morphine (PF) 2 MG/ML injection 2 mg (has no administration in time range)  ondansetron (ZOFRAN) tablet 4 mg (has no administration in time range)    Or  ondansetron (ZOFRAN) injection 4 mg (has no administration in time range)  cefTRIAXone (ROCEPHIN) 1 g in sodium chloride 0.9 % 100 mL IVPB (has no administration in time range)    ED Course/ Medical Decision Making/ A&P                           Medical Decision Making Risk Decision regarding hospitalization.   Appears wel. ECG unchanged. However trop earlier went from 22-->164 and in a 88  year old. Will d/w TRH for observation and repeat troponins/workup as needed.   Final Clinical Impression(s) / ED Diagnoses Final diagnoses:  None    Rx / DC Orders ED Discharge Orders     None         Jonathon Castelo, Corene Cornea,  MD 04/04/22 [id number]

## 2022-04-04 NOTE — Hospital Course (Signed)
88 year old female with a history of DCIS of the right breast, hypertension, hypothyroidism, tobacco abuse presenting with generalized weakness, altered mental status, and poor oral intake.  The patient had been in her usual state of health until the patient's family was bringing her back from [place] after getting her nails done.  Apparently, the patient appeared to doze off and drift off in the car.When they got home the patient was too weak to get out of the car and had to be carried out.  At baseline, the patient ambulates with a cane and walker at home.  Daughter denies any recent new medications.  The patient is a poor historian.  History is also supplemented by the patient's daughter.  Apparently the patient has had poor oral intake for the past 2 to 3 months.Her daughter reports the patient has not really drink water, generally only Pepsi, and candy, does not eat much food of what she considers of good nutritional value.  There is been no complaints of fevers, chills, chest pain, shortness breath, nausea, vomiting, diarrhea, abdominal pain.  Apparently the patient has been complaining of some dizziness. In the ED, the patient was initially diagnosed with a UTI.  She was given a dose of ceftriaxone and discharged home with cephalexin.  However she was called back in the ED when her troponin went up from 22 to 163. In the ED, the patient was afebrile and hemodynamically stable with oxygen saturation 98-99% room air.  WBC 6.4, hemoglobin 9.8, platelets 246,000.  Sodium 123, potassium 3.2, bicarbonate 23, serum creatinine 1.10.  Troponins 62>> 163>> 183>> 138.

## 2022-04-04 NOTE — Assessment & Plan Note (Signed)
Baseline creatinine 0.9-1.1 

## 2022-04-04 NOTE — Assessment & Plan Note (Signed)
-   Creatinine at baseline 0.95 -Creatinine today 1.26 -Mild AKI -Reduce gabapentin dosage while patient is in the hospital -Holding lisinopril and HCTZ given hypotension earlier and bump in creatinine -Trend with morning labs

## 2022-04-04 NOTE — H&P (Signed)
History and Physical    Patient: Ariana Carney MWU:[id number] DOB: [birth year] DOA: 04/04/2022 DOS: the patient was seen and examined on 04/04/2022 PCP: [name], MD  Patient coming from: Home  Chief Complaint: No chief complaint on file.  HPI: Ariana Carney is a 88 y.o. female with medical history significant of with history of breast cancer, hypertension, thyroid disease, and more presents to the ED with a chief complaint of altered mental status.  Patient was fine during the first part of the day on the 13th, and then in the afternoon she started acting dazed, less responsive, and tired but still awake.  Daughter brought her into the ER for that reason.  Her work-up revealed a UTI.  She was given Rocephin in the ER and discharged home with Keflex.  Her initial lab work there was a troponin of 22, so repeat had been drawn, but patient was discharged prior to the result.  The result came back at 163, so they received a call to come back into the ER.  Daughter reports the patient had not had any complaints.  She was recently treated for UTI in the outpatient setting 1 month ago.  Daughter reports that she has had a cough for 1 to 2 months, the patient is a current smoker.  Patient herself to says yes ma'am to all questions, even when yes/no would not be an appropriate answer.  So further history could not be obtained.  It is worth noting that patient was hypotensive on the first ER visit down to 88/58.  Daughter denies patient complaining of feeling lightheaded.  Patient is a current smoker.  She declines nicotine patch at this time.  She does not drink, or use illicit drugs.  She has been vaccinated for COVID.  Family has never discussed she will remain full code. Review of Systems: unable to review all systems due to the inability of the patient to answer questions. Past Medical History:  Diagnosis Date   Breast cancer ([place]) 2007   right   DCIS (ductal carcinoma in situ) of right breast  08/22/2011   Heart disease    Hypertension    Thyroid disease    Ulcer    Past Surgical History:  Procedure Laterality Date   ABDOMINAL HYSTERECTOMY     CHOLECYSTECTOMY     MASTECTOMY Left 10+ years ago   Social History:  reports that she has been smoking cigarettes. She has never used smokeless tobacco. She reports that she does not drink alcohol and does not use drugs.  No Known Allergies  No family history on file.  Prior to Admission medications   Medication Sig Start Date End Date Taking? Authorizing Provider  cephALEXin (KEFLEX) 500 MG capsule Take 1 capsule (500 mg total) by mouth 2 (two) times daily for 5 days. 04/03/22 04/08/22  Wyvonnia Dusky, MD  digoxin (LANOXIN) 0.125 MG tablet Take 125 mcg by mouth daily.      [provider]  famotidine (PEPCID) 20 MG tablet Take 1 tablet (20 mg total) by mouth 2 (two) times daily for 5 days. 05/20/21 04/03/22  Wurst, Marye Round, PA-C  gabapentin (NEURONTIN) 300 MG capsule Take 100 mg by mouth 3 (three) times daily.    [provider]  levothyroxine (SYNTHROID, LEVOTHROID) 50 MCG tablet Take 50 mcg by mouth daily.      [provider]  lisinopril-hydrochlorothiazide (PRINZIDE,ZESTORETIC) 10-12.5 MG per tablet Take 1 tablet by mouth daily.    [provider]  valACYclovir (  VALTREX) 1000 MG tablet Take 1 tablet (1,000 mg total) by mouth 3 (three) times daily. 09/27/21   Volney American, PA-C    Physical Exam: Vitals:   04/04/22 0258 04/04/22 0300  BP:  124/73  Pulse:  73  Resp:  18  Temp:  98.3 F (36.8 C)  TempSrc:  Oral  SpO2:  98%  Weight: 48.5 kg   Height: '5\' 1"'$  (1.549 m)    1.  General: Patient lying supine in bed,  no acute distress   2. Psychiatric: Somnolent and oriented x 3, mood and behavior normal for situation, pleasant and cooperative with exam   3. Neurologic: Speech is normal the patient giving inappropriate answers to questions, face is symmetric, moves all 4  extremities voluntarily, at baseline without acute deficits on limited exam   4. HEENMT:  Head is atraumatic, normocephalic, pupils reactive to light, neck is supple, trachea is midline, mucous membranes are moist   5. Respiratory : Lungs are clear to auscultation bilaterally without wheezing, rhonchi, rales, no cyanosis, no increase in work of breathing or accessory muscle use   6. Cardiovascular : Heart rate normal, rhythm is regular, murmur present, rubs or gallops, no peripheral edema, peripheral pulses palpated   7. Gastrointestinal:  Abdomen is soft, nondistended, nontender to palpation bowel sounds active, no masses or organomegaly palpated   8. Skin:  Skin is warm, dry and intact without rashes, acute lesions, or ulcers on limited exam   9.Musculoskeletal:  No acute deformities or trauma, no asymmetry in tone, no peripheral edema, peripheral pulses palpated, no tenderness to palpation in the extremities  Data Reviewed: In the ED Temp 98.3, heart rate 66-76, respiratory rate 16-20, blood pressure 88/58-145/81, satting 98% Initial troponins 22, 163 and then on this visit troponin continues to rise 283 No leukocytosis with white blood cell count of 5.1, hemoglobin 10.7, platelets 250 Chemistry reveals an elevated creatinine at 1.26 UA is indicative of UTI Urine culture pending Chest x-ray shows no active cardiopulmonary disease EKG shows a heart rate of 72, sinus rhythm, QTc 446 with a left bundle-there was also a left bundle present on her March 2023 EKG. no acute ischemic changes Admission requested for further management and work-up of elevated troponin  Assessment and Plan: * Elevated troponin - Troponin initially 22, increased to 163 -This is in the setting of mild AKI, and hypotension earlier -Third troponin pending -Echo in the a.m. -Consult cardiology -Given advanced age I do not think this patient will be taken to the Cath Lab -EKG shows left bundle branch block  that was also there on the January 09, 2022 EKG. -No acute ischemic changes -Chest x-ray shows no active cardiopulmonary disease -Patient has been chest pain-free without dyspnea  Hypothyroidism - TSH with a.m. labs -Continue Synthroid  GERD (gastroesophageal reflux disease) - Continue Pepcid  AKI (acute kidney injury) (HCC) - Creatinine at baseline 0.95 -Creatinine today 1.26 -Mild AKI -Reduce gabapentin dosage while patient is in the hospital -Holding lisinopril and HCTZ given hypotension earlier and bump in creatinine -Trend with morning labs  UTI (urinary tract infection) - UA indicative of UTI -Rocephin given at first ER visit, with Keflex at discharge -Continue Rocephin, hold Keflex for now -Urine culture pending -Continue to monitor  Acute metabolic encephalopathy - Described as a period Of looking dazed and tired but still awake -Family reports improvement -Nonfocal exam -Most likely secondary to UTI -Continue to monitor      Advance Care Planning:   Code  Status: Full Code   Consults: Cardiology  Family Communication: Daughter at bedside  Severity of Illness: The appropriate patient status for this patient is OBSERVATION. Observation status is judged to be reasonable and necessary in order to provide the required intensity of service to ensure the patient's safety. The patient's presenting symptoms, physical exam findings, and initial radiographic and laboratory data in the context of their medical condition is felt to place them at decreased risk for further clinical deterioration. Furthermore, it is anticipated that the patient will be medically stable for discharge from the hospital within 2 midnights of admission.   Author: Rolla Plate, DO 04/04/2022 4:34 AM  For on call review [email or web address].

## 2022-04-04 NOTE — Assessment & Plan Note (Addendum)
Replete Check magnesium 

## 2022-04-04 NOTE — Progress Notes (Signed)
*  PRELIMINARY RESULTS* Echocardiogram 2D Echocardiogram has been performed.  Ariana Carney 04/04/2022, 4:10 PM

## 2022-04-05 DIAGNOSIS — G9341 Metabolic encephalopathy: Secondary | ICD-10-CM | POA: Diagnosis not present

## 2022-04-05 DIAGNOSIS — E871 Hypo-osmolality and hyponatremia: Secondary | ICD-10-CM

## 2022-04-05 DIAGNOSIS — N1831 Chronic kidney disease, stage 3a: Secondary | ICD-10-CM | POA: Diagnosis not present

## 2022-04-05 DIAGNOSIS — Z720 Tobacco use: Secondary | ICD-10-CM | POA: Diagnosis not present

## 2022-04-05 DIAGNOSIS — F1120 Opioid dependence, uncomplicated: Secondary | ICD-10-CM

## 2022-04-05 DIAGNOSIS — R778 Other specified abnormalities of plasma proteins: Secondary | ICD-10-CM | POA: Diagnosis not present

## 2022-04-05 DIAGNOSIS — R531 Weakness: Secondary | ICD-10-CM

## 2022-04-05 LAB — MAGNESIUM: Magnesium: 2.1 mg/dL (ref 1.7–2.4)

## 2022-04-05 LAB — BASIC METABOLIC PANEL
Anion gap: 5 (ref 5–15)
BUN: 16 mg/dL (ref 8–23)
CO2: 23 mmol/L (ref 22–32)
Calcium: 7.9 mg/dL — ABNORMAL LOW (ref 8.9–10.3)
Chloride: 103 mmol/L (ref 98–111)
Creatinine, Ser: 1.01 mg/dL — ABNORMAL HIGH (ref 0.44–1.00)
GFR, Estimated: 50 mL/min — ABNORMAL LOW (ref >60)
Glucose, Bld: 80 mg/dL (ref 70–99)
Potassium: 4.1 mmol/L (ref 3.5–5.1)
Sodium: 131 mmol/L — ABNORMAL LOW (ref 135–145)

## 2022-04-05 MED ORDER — CYANOCOBALAMIN 500 MCG PO TABS: 500.0000 ug | ORAL_TABLET | Freq: Every day | ORAL | Status: DC

## 2022-04-05 MED ORDER — CEFDINIR 300 MG PO CAPS: 300.0000 mg | ORAL_CAPSULE | Freq: Every day | ORAL | Status: DC

## 2022-04-05 MED ORDER — GABAPENTIN 300 MG PO CAPS: 300.0000 mg | ORAL_CAPSULE | Freq: Two times a day (BID) | ORAL | 1 refills | Status: DC

## 2022-04-05 MED ORDER — CEFDINIR 300 MG PO CAPS: 300.0000 mg | ORAL_CAPSULE | Freq: Two times a day (BID) | ORAL | Status: DC

## 2022-04-05 MED ORDER — CEFDINIR 300 MG PO CAPS: 300.0000 mg | ORAL_CAPSULE | Freq: Two times a day (BID) | ORAL | 0 refills | Status: DC

## 2022-04-05 MED ORDER — FOLIC ACID 1 MG PO TABS: 1.0000 mg | ORAL_TABLET | Freq: Every day | ORAL | Status: DC

## 2022-04-05 NOTE — Care Management Obs Status (Signed)
[facility] NOTIFICATION   Patient Details  Name: Ariana Carney MRN: [id number] Date of Birth: 1919/10/28   Medicare Observation Status Notification Given:  Yes    Ihor Gully, LCSW 04/05/2022, 12:43 PM

## 2022-04-05 NOTE — Discharge Summary (Signed)
Physician Discharge Summary   Patient: Ariana Carney MRN: [id number] DOB: [birth year]  Admit date:     04/04/2022  Discharge date: 04/05/22  Discharge Physician: Shanon Brow Caydyn Sprung   PCP: [name], MD   Recommendations at discharge:   Please follow up with primary care provider within 1-2 weeks  Please repeat BMP and CBC in one week    Hospital Course: 88 year old female with a history of DCIS of the right breast, hypertension, hypothyroidism, tobacco abuse presenting with generalized weakness, altered mental status, and poor oral intake.  The patient had been in her usual state of health until the patient's family was bringing her back from [place] after getting her nails done.  Apparently, the patient appeared to doze off and drift off in the car.When they got home the patient was too weak to get out of the car and had to be carried out.  At baseline, the patient ambulates with a cane and walker at home.  Daughter denies any recent new medications.  The patient is a poor historian.  History is also supplemented by the patient's daughter.  Apparently the patient has had poor oral intake for the past 2 to 3 months.Her daughter reports the patient has not really drink water, generally only Pepsi, and candy, does not eat much food of what she considers of good nutritional value.  There is been no complaints of fevers, chills, chest pain, shortness breath, nausea, vomiting, diarrhea, abdominal pain.  Apparently the patient has been complaining of some dizziness. In the ED, the patient was initially diagnosed with a UTI.  She was given a dose of ceftriaxone and discharged home with cephalexin.  However she was called back in the ED when her troponin went up from 22 to 163. In the ED, the patient was afebrile and hemodynamically stable with oxygen saturation 98-99% room air.  WBC 6.4, hemoglobin 9.8, platelets 246,000.  Sodium 123, potassium 3.2, bicarbonate 23, serum creatinine 1.10.  Troponins 62>>  163>> 183>> 138.  Assessment and Plan: * Elevated troponin - Troponins 62>> 163>> 183>> 138. -No chest pain reported -Likely demand ischemia -Echo EF 60-65%, G1DD, trivial MR, no WMA -Personally reviewed EKG--sinus rhythm, LBBB  Acute metabolic encephalopathy - Described as a period Of looking dazed and tired but still awake and conversant -Family reports improvement--states the patient is at baseline in the ED -Multifactorial including low sodium, UTI, low B12 -improved, back to baseline at time of d/c  Hyponatremia Suspect volume depletion and poor solute intake -Serum osmolarity--254 -Urine osmolarity--248 -FeNa--0.6% -Continue isotonic saline for now>>improved to 131 D/c lisino/HCTZ--BP remains stable  UTI (urinary tract infection) - UA >50 WBC -Rocephin given at first ER visit, with Keflex at discharge -Continue Rocephin, hold Keflex for now -Urine culture pending at time of dc -d/c with cefdinir x 3 more days  Generalized weakness Multifactorial including UTI, hyponatremia, deconditioning W23--762 Folic GBTD--1.7 UA suggestive of UTI PT evaluation TSH 1.541  Tobacco abuse Smokes 5-10 cigarettes per day Cessation discussed  Opioid dependence ([place]) PDMP reviewed -Patient receiving Percocet 5/325 #30, last refill 12/20/2021 -Patient has been taking secondary to what appears to be postherpetic neuralgia from recent shingles  Chronic kidney disease, stage 3a (HCC) Baseline creatinine 0.9-1.1  Hypokalemia Replete -Check magnesium 2.1  Hypothyroidism - TSH--1.541 -Continue Synthroid  GERD (gastroesophageal reflux disease) - Continue Pepcid  Post Herpetic Neuralgia -increased gabapentin to 300 mg bid       Consultants: none Procedures performed: none  Disposition: Home Diet recommendation:  Regular  diet DISCHARGE MEDICATION: Allergies as of 04/05/2022   No Known Allergies      Medication List     STOP taking these medications     cephALEXin 500 MG capsule Commonly known as: KEFLEX   digoxin 0.125 MG tablet Commonly known as: LANOXIN   lisinopril-hydrochlorothiazide 10-12.5 MG tablet Commonly known as: ZESTORETIC       TAKE these medications    cefdinir 300 MG capsule Commonly known as: OMNICEF Take 1 capsule (300 mg total) by mouth every 12 (twelve) hours. Start taking on: April 06, 2022   famotidine 20 MG tablet Commonly known as: PEPCID Take 1 tablet (20 mg total) by mouth 2 (two) times daily for 5 days.   folic acid 1 MG tablet Commonly known as: FOLVITE Take 1 tablet (1 mg total) by mouth daily. Start taking on: April 06, 2022   gabapentin 300 MG capsule Commonly known as: NEURONTIN Take 1 capsule (300 mg total) by mouth 2 (two) times daily. What changed:  how much to take when to take this   levothyroxine 50 MCG tablet Commonly known as: SYNTHROID Take 50 mcg by mouth daily.   vitamin B-12 500 MCG tablet Commonly known as: CYANOCOBALAMIN Take 1 tablet (500 mcg total) by mouth daily. Start taking on: April 06, 2022        Discharge Exam: Ariana Carney Weights   04/04/22 0258 04/04/22 1005  Weight: 48.5 kg 54.3 kg   HEENT:  [place]/AT, No thrush, no icterus CV:  RRR, no rub, no S3, no S4 Lung:  CTA, no wheeze, no rhonchi Abd:  soft/+BS, NT Ext:  No edema, no lymphangitis, no synovitis, no rash   Condition at discharge: stable  The results of significant diagnostics from this hospitalization (including imaging, microbiology, ancillary and laboratory) are listed below for reference.   Imaging Studies: ECHOCARDIOGRAM COMPLETE  Result Date: 04/04/2022    ECHOCARDIOGRAM REPORT   Patient Name:   Ariana Carney Date of Exam: 04/04/2022 Medical Rec #:  [id number]         Height:       61.0 in Accession #:    [id number]        Weight:       119.7 lb Date of Birth:  [birth year]         BSA:          1.519 m Patient Age:    89 years         BP:           110/75 mmHg Patient Gender: F                  HR:           62 bpm. Exam Location:  [facility] Procedure: 2D Echo, Cardiac Doppler and Color Doppler Indications:    Elevated Troponin  History:        Patient has no prior history of Echocardiogram examinations.                 Breast CA-2007.  Sonographer:    Wenda Low Referring Phys: [id number] ASIA B [place]  1. Left ventricular ejection fraction, by estimation, is 60 to 65%. The left ventricle has normal function. The left ventricle has no regional wall motion abnormalities. There is mild concentric left ventricular hypertrophy. Left ventricular diastolic parameters are consistent with Grade I diastolic dysfunction (impaired relaxation). Elevated left ventricular end-diastolic pressure.  2. Right ventricular systolic function is normal. The right ventricular  size is normal. There is normal pulmonary artery systolic pressure. The estimated right ventricular systolic pressure is 54.6 mmHg.  3. The mitral valve is degenerative. Trivial mitral valve regurgitation. No evidence of mitral stenosis.  4. The aortic valve was not well visualized. Aortic valve regurgitation is not visualized. Aortic valve sclerosis/calcification is present, without any evidence of aortic stenosis. Aortic valve area, by VTI measures 1.79 cm. Aortic valve mean gradient measures 5.0 mmHg. Aortic valve Vmax measures 1.49 m/s.  5. The inferior vena cava is normal in size with greater than 50% respiratory variability, suggesting right atrial pressure of 3 mmHg. FINDINGS  Left Ventricle: Left ventricular ejection fraction, by estimation, is 60 to 65%. The left ventricle has normal function. The left ventricle has no regional wall motion abnormalities. The left ventricular internal cavity size was normal in size. There is  mild concentric left ventricular hypertrophy. Left ventricular diastolic parameters are consistent with Grade I diastolic dysfunction (impaired relaxation). Elevated left ventricular end-diastolic  pressure. Right Ventricle: The right ventricular size is normal. No increase in right ventricular wall thickness. Right ventricular systolic function is normal. There is normal pulmonary artery systolic pressure. The tricuspid regurgitant velocity is 2.47 m/s, and  with an assumed right atrial pressure of 3 mmHg, the estimated right ventricular systolic pressure is 50.3 mmHg. Left Atrium: Left atrial size was normal in size. Right Atrium: Right atrial size was normal in size. Pericardium: There is no evidence of pericardial effusion. Mitral Valve: The mitral valve is degenerative in appearance. There is mild calcification of the anterior mitral valve leaflet(s). Mild mitral annular calcification. Trivial mitral valve regurgitation. No evidence of mitral valve stenosis. MV peak gradient, 4.5 mmHg. The mean mitral valve gradient is 2.0 mmHg. Tricuspid Valve: The tricuspid valve is normal in structure. Tricuspid valve regurgitation is mild . No evidence of tricuspid stenosis. Aortic Valve: The aortic valve was not well visualized. Aortic valve regurgitation is not visualized. Aortic valve sclerosis/calcification is present, without any evidence of aortic stenosis. Aortic valve mean gradient measures 5.0 mmHg. Aortic valve peak gradient measures 8.9 mmHg. Aortic valve area, by VTI measures 1.79 cm. Pulmonic Valve: The pulmonic valve was normal in structure. Pulmonic valve regurgitation is not visualized. No evidence of pulmonic stenosis. Aorta: The aortic root is normal in size and structure. Venous: The inferior vena cava is normal in size with greater than 50% respiratory variability, suggesting right atrial pressure of 3 mmHg. IAS/Shunts: No atrial level shunt detected by color flow Doppler.  LEFT VENTRICLE PLAX 2D LVIDd:         3.40 cm   Diastology LVIDs:         2.10 cm   LV e' medial:    5.00 cm/s LV PW:         1.30 cm   LV E/e' medial:  17.2 LV IVS:        1.10 cm   LV e' lateral:   5.87 cm/s LVOT diam:      1.90 cm   LV E/e' lateral: 14.7 LV SV:         54 LV SV Index:   36 LVOT Area:     2.84 cm  RIGHT VENTRICLE RV Basal diam:  2.80 cm RV Mid diam:    2.20 cm RV S prime:     10.70 cm/s TAPSE (M-mode): 2.3 cm LEFT ATRIUM             Index        RIGHT  ATRIUM           Index LA diam:        3.00 cm 1.98 cm/m   RA Area:     11.70 cm LA Vol (A2C):   41.1 ml 27.07 ml/m  RA Volume:   27.00 ml  17.78 ml/m LA Vol (A4C):   24.8 ml 16.33 ml/m LA Biplane Vol: 34.6 ml 22.78 ml/m  AORTIC VALVE                     PULMONIC VALVE AV Area (Vmax):    2.25 cm      PV Vmax:       0.64 m/s AV Area (Vmean):   1.67 cm      PV Peak grad:  1.7 mmHg AV Area (VTI):     1.79 cm AV Vmax:           149.00 cm/s AV Vmean:          104.000 cm/s AV VTI:            0.304 m AV Peak Grad:      8.9 mmHg AV Mean Grad:      5.0 mmHg LVOT Vmax:         118.00 cm/s LVOT Vmean:        61.400 cm/s LVOT VTI:          0.192 m LVOT/AV VTI ratio: 0.63  AORTA Ao Root diam: 2.50 cm MITRAL VALVE               TRICUSPID VALVE MV Area (PHT): 3.23 cm    TR Peak grad:   24.4 mmHg MV Area VTI:   1.67 cm    TR Vmax:        247.00 cm/s MV Peak grad:  4.5 mmHg MV Mean grad:  2.0 mmHg    SHUNTS MV Vmax:       1.06 m/s    Systemic VTI:  0.19 m MV Vmean:      74.0 cm/s   Systemic Diam: 1.90 cm MV Decel Time: 235 msec MV E velocity: 86.10 cm/s MV A velocity: 96.80 cm/s MV E/A ratio:  0.89 Fransico Him MD Electronically signed by Fransico Him MD Signature Date/Time: 04/04/2022/4:16:06 PM    Final    MM 3D SCREEN BREAST UNI LEFT  Result Date: 04/04/2022 CLINICAL DATA:  Screening. EXAM: DIGITAL SCREENING UNILATERAL LEFT MAMMOGRAM WITH CAD AND TOMOSYNTHESIS TECHNIQUE: Left screening digital craniocaudal and mediolateral oblique mammograms were obtained. Left screening digital breast tomosynthesis was performed. The images were evaluated with computer-aided detection. COMPARISON:  Previous exam(s). ACR Breast Density Category b: There are scattered areas of  fibroglandular density. FINDINGS: The patient has had a right mastectomy. There are no findings suspicious for malignancy. IMPRESSION: No mammographic evidence of malignancy. A result letter of this screening mammogram will be mailed directly to the patient. RECOMMENDATION: Screening mammogram in one year.  (Code:SM-L-42M) BI-RADS CATEGORY  1: Negative. Electronically Signed   By: Kristopher Oppenheim M.D.   On: 04/04/2022 11:22   DG Chest 2 View  Result Date: 04/03/2022 CLINICAL DATA:  Cough. EXAM: CHEST - 2 VIEW COMPARISON:  Chest radiograph dated 10/18/2021. FINDINGS: No focal consolidation, pleural effusion, or pneumothorax. The cardiac silhouette is within limits. Atherosclerotic calcification of the aorta. Osteopenia degenerative changes of the spine. Right chest wall surgical clips. No acute osseous pathology. IMPRESSION: No active cardiopulmonary disease. Electronically Signed   By: Laren Everts.D.  On: 04/03/2022 20:44    Microbiology: Results for orders placed or performed during the hospital encounter of 04/03/22  Urine Culture     Status: Abnormal (Preliminary result)   Collection Time: 04/03/22 10:13 PM   Specimen: Urine, Clean Catch  Result Value Ref Range Status   Specimen Description   Final    URINE, CLEAN CATCH Performed at [facility], [address], [place], [place] [address]    Special Requests   Final    NONE Performed at [facility], [address] [place], [place] [address]    Culture >=100,000 COLONIES/mL GRAM NEGATIVE RODS (A)  Final   Report Status PENDING  Incomplete    Labs: CBC: Recent Labs  Lab 04/03/22 2011 04/04/22 0427  WBC 5.1 6.4  NEUTROABS  --  3.5  HGB 10.7* 9.8*  HCT 32.1* 28.7*  MCV 86.3 85.2  PLT 250 283   Basic Metabolic Panel: Recent Labs  Lab 04/03/22 2011 04/04/22 0430 04/05/22 0513  NA 127* 123* 131*  K 3.7 3.2* 4.1  CL 95* 93* 103  CO2 '25 23 23  '$ GLUCOSE 126* 70 80  BUN '17 20 16  '$ CREATININE 1.26* 1.10* 1.01*  CALCIUM  8.6* 8.0* 7.9*  MG  --  1.7 2.1   Liver Function Tests: Recent Labs  Lab 04/04/22 0430  AST 14*  ALT 7  ALKPHOS 52  BILITOT 0.6  PROT 5.6*  ALBUMIN 3.0*   CBG: No results for input(s): "GLUCAP" in the last 168 hours.  Discharge time spent: greater than 30 minutes.  Signed: Orson Eva, MD [facility] 04/05/2022

## 2022-04-05 NOTE — Progress Notes (Signed)
   Echo personally reviewed - normal systolic function, mild diastolic dysfunction, no new regional WMA's - suspect troponin elevations d/t infection or other non-cardiac cause.  Agree she is not a candidate for invasive work-up. Agree with plans to stop digoxin.  No further recommendations at this time. Cardiology will sign-off.  [facility] will sign off.   Medication Recommendations: stop digoxin Other recommendations (labs, testing, etc):  none Follow up as an outpatient:  Dr. Sherren Mocha, MD, [facility], [place] Director of the [facility]id [facility]  Cardiovascular Risk Reduction Clinic Diplomate of the American Board of Clinical Lipidology Attending Cardiologist  Direct Dial: [phone]  Fax: [phone]  Website:  [email or web address]

## 2022-04-06 LAB — URINE CULTURE: Culture: >=100000 — AB

## 2022-04-07 ENCOUNTER — Telehealth: Payer: Self-pay | Admitting: Emergency Medicine

## 2022-04-07 NOTE — Telephone Encounter (Signed)
Post ED Visit - Positive Culture Follow-up  Culture report reviewed by antimicrobial stewardship pharmacist: [place] Team '[]'$  Elenor Quinones, Pharm.D. '[]'$  Heide Guile, Pharm.D., BCPS AQ-ID '[]'$  Parks Neptune, Pharm.D., BCPS '[]'$  Alycia Rossetti, Pharm.D., BCPS '[]'$  [place], [place].D., BCPS, AAHIVP '[]'$  Legrand Como, Pharm.D., BCPS, AAHIVP '[]'$  Salome Arnt, PharmD, BCPS '[]'$  Johnnette Gourd, PharmD, BCPS '[]'$  Hughes Better, PharmD, BCPS '[]'$  Leeroy Cha, PharmD '[]'$  Laqueta Linden, PharmD, BCPS '[]'$  Albertina Parr, PharmD  [place] Team '[]'$  Leodis Sias, PharmD '[]'$  Lindell Spar, PharmD '[]'$  Royetta Asal, PharmD '[]'$  Graylin Shiver, Rph '[]'$  Rema Fendt) Glennon Mac, PharmD '[]'$  Arlyn Dunning, PharmD '[]'$  Netta Cedars, PharmD '[]'$  Dia Sitter, PharmD '[]'$  Leone Haven, PharmD '[]'$  Gretta Arab, PharmD '[]'$  Theodis Shove, PharmD '[]'$  Peggyann Juba, PharmD '[]'$  Reuel Boom, PharmD   Positive urine culture Treated with cephalexin, organism sensitive to the same and no further patient follow-up is required at this time.  Hazle Nordmann 04/07/2022, 11:53 AM

## 2022-04-09 ENCOUNTER — Encounter: Payer: Self-pay | Admitting: Podiatry

## 2022-04-09 NOTE — Progress Notes (Signed)
  Subjective:  Patient ID: Ariana Carney, female    DOB: [birth year],  MRN: [id number]  Ariana Carney presents to clinic today for at risk foot care with history of peripheral neuropathy and callus(es) right lower extremity and painful thick toenails that are difficult to trim. Painful toenails interfere with ambulation. Aggravating factors include wearing enclosed shoe gear. Pain is relieved with periodic professional debridement. Painful calluses are aggravated when weightbearing with and without shoegear. Pain is relieved with periodic professional debridement.  Patient is accompanied by her daughter on today's visit.  New problem(s): None.   PCP is [name], MD , and last visit was January 16, 2022.  No Known Allergies  Review of Systems: Negative except as noted in the HPI.  Obje General: Patient is a pleasant y. 88 y.o. African American female in NAD. AAO x 3.   Neurovascular Examination: Capillary refill time to digits <3 seconds b/l. Palpable pedal pulses b/l LE. Pedal hair absent. Lower extremity skin temperature gradient within normal limits. No edema noted b/l lower extremities.   Pt has subjective symptoms of neuropathy. Protective sensation intact 5/5 intact bilaterally with 10g monofilament b/l. Vibratory sensation intact b/l.  Dermatological:  Skin warm and supple b/l lower extremities. No open wounds b/l lower extremities. No interdigital macerations b/l lower extremities. Toenails 1-5 b/l elongated, discolored, dystrophic, thickened, crumbly with subungual debris and tenderness to dorsal palpation. Hyperkeratotic lesion(s) submetatarsal head 5 right foot with tenderness to palpation. No edema, no erythema, no drainage, no fluctuance.   Musculoskeletal:  Normal muscle strength 5/5 to all lower extremity muscle groups bilaterally. No pain crepitus or joint limitation noted with ROM b/l lower extremities.Gross deformities: Hallux valgus with bunion deformity noted b/l  lower extremities.  Assessment/Plan: 1. Pain due to onychomycosis of toenails of both feet   2. Callus   3. Vitamin B12 deficiency   4. Neuropathy     -Patient was evaluated and treated. All patient's and/or POA's questions/concerns answered on today's visit. -Medicaid ABN signed. Patient consents for services of paring of corn(s)/callus(es)/porokeratos(es) today. Copy in patient chart. -Patient to continue soft, supportive shoe gear daily. -Toenails 1-5 b/l were debrided in length and girth with sterile nail nippers and dremel without iatrogenic bleeding.  -Callus(es) submet head 5 right foot pared utilizing sterile scalpel blade without complication or incident. Total number debrided =1. -Patient/POA to call should there be question/concern in the interim.   Return in about 3 months (around 07/04/2022).  Marzetta Board, DPM

## 2022-04-10 DIAGNOSIS — N390 Urinary tract infection, site not specified: Secondary | ICD-10-CM | POA: Diagnosis not present

## 2022-04-10 DIAGNOSIS — D519 Vitamin B12 deficiency anemia, unspecified: Secondary | ICD-10-CM | POA: Diagnosis not present

## 2022-07-10 ENCOUNTER — Ambulatory Visit (INDEPENDENT_AMBULATORY_CARE_PROVIDER_SITE_OTHER): Payer: Medicare Other | Admitting: Podiatry

## 2022-07-10 DIAGNOSIS — Z91199 Patient's noncompliance with other medical treatment and regimen due to unspecified reason: Secondary | ICD-10-CM

## 2022-07-10 NOTE — Progress Notes (Signed)
No show

## 2022-07-11 DIAGNOSIS — I10 Essential (primary) hypertension: Secondary | ICD-10-CM | POA: Diagnosis not present

## 2022-07-11 DIAGNOSIS — B0223 Postherpetic polyneuropathy: Secondary | ICD-10-CM | POA: Diagnosis not present

## 2022-07-11 DIAGNOSIS — E871 Hypo-osmolality and hyponatremia: Secondary | ICD-10-CM | POA: Diagnosis not present

## 2022-07-11 NOTE — Progress Notes (Signed)
Left message on both numbers for Ariana Carney, spoke with [facility] and she said to leave message at the house and someone will call back.

## 2022-09-30 IMAGING — DX DG CHEST 2V
2 series · 2 of 2 positions shown · non-contrast
Comparison: 09/02/2012

CLINICAL DATA: [AGE] female with chest pain

EXAM:
CHEST - 2 VIEW

[chest lat]
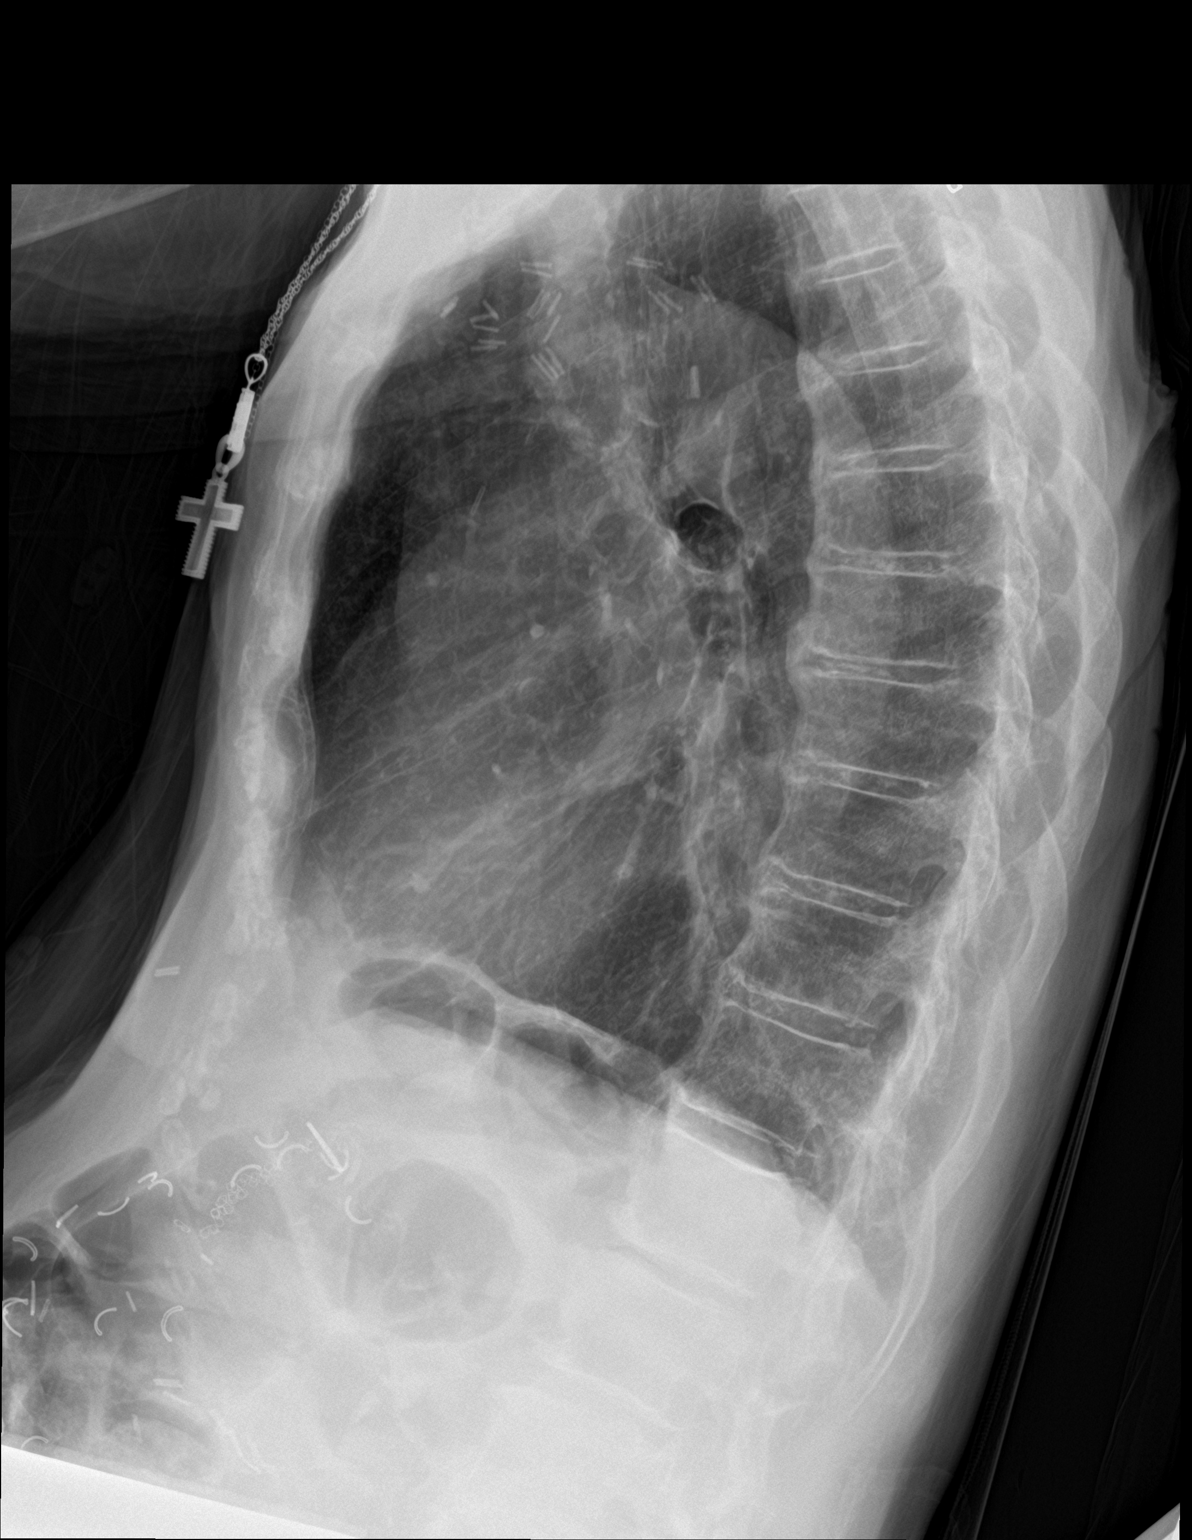

[chest ap]
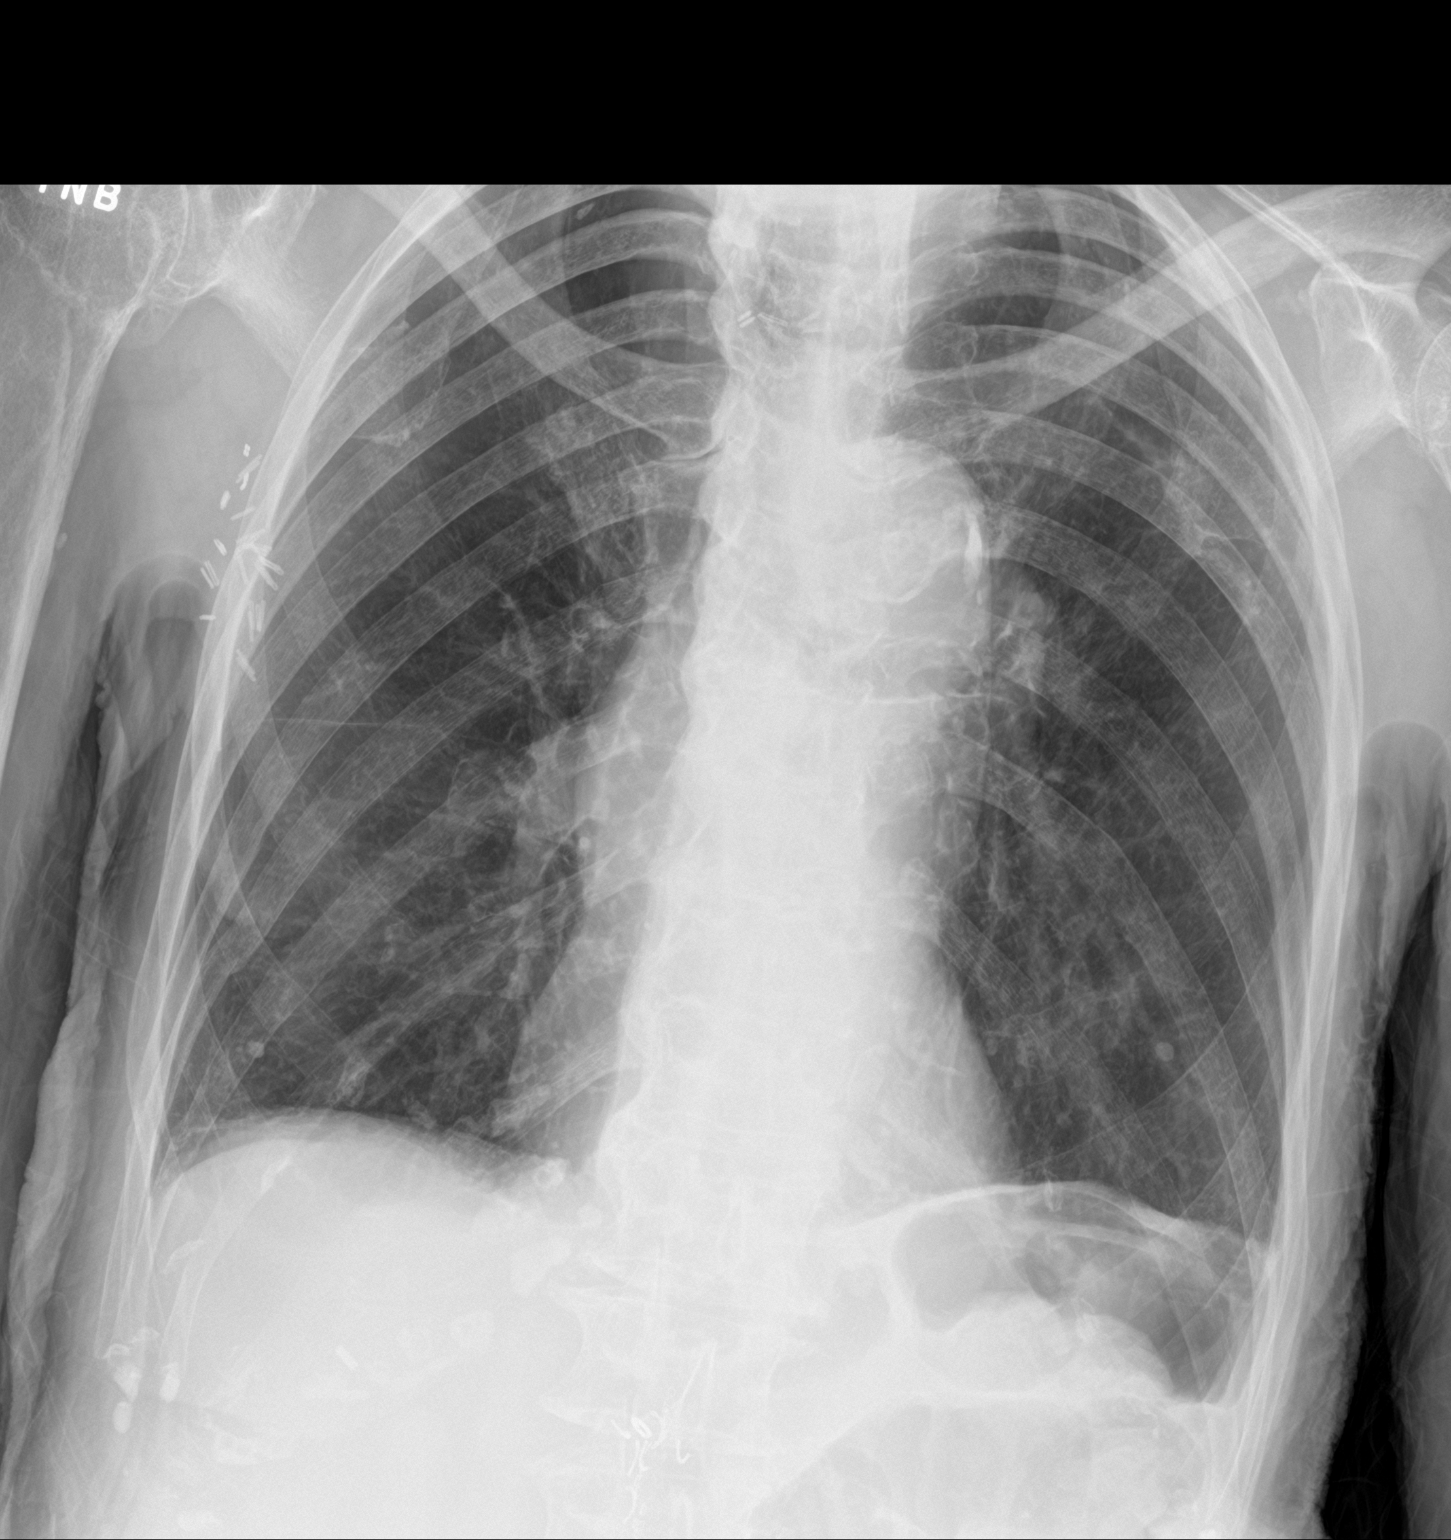

[2 of 2 positions shown; findings below may reference images not displayed]

FINDINGS: Cardiomediastinal silhouette unchanged in size and contour. No
evidence of central vascular congestion. No interlobular septal
thickening.

Stigmata of emphysema, with increased retrosternal airspace,
flattened hemidiaphragms, increased AP diameter, and hyperinflation
on the AP view.

Surgical changes of the right chest wall.

No pneumothorax or pleural effusion. Coarsened interstitial
markings, with no confluent airspace disease.

No acute displaced fracture. Degenerative changes of the spine.
IMPRESSION: Emphysema and chronic lung changes without definite evidence of
acute cardiopulmonary disease.

## 2022-10-05 DIAGNOSIS — E871 Hypo-osmolality and hyponatremia: Secondary | ICD-10-CM | POA: Diagnosis not present

## 2022-10-05 DIAGNOSIS — Z79899 Other long term (current) drug therapy: Secondary | ICD-10-CM | POA: Diagnosis not present

## 2022-10-05 DIAGNOSIS — E039 Hypothyroidism, unspecified: Secondary | ICD-10-CM | POA: Diagnosis not present

## 2022-10-05 DIAGNOSIS — I10 Essential (primary) hypertension: Secondary | ICD-10-CM | POA: Diagnosis not present

## 2022-10-12 DIAGNOSIS — E039 Hypothyroidism, unspecified: Secondary | ICD-10-CM | POA: Diagnosis not present

## 2022-10-12 DIAGNOSIS — N1831 Chronic kidney disease, stage 3a: Secondary | ICD-10-CM | POA: Diagnosis not present

## 2022-10-12 DIAGNOSIS — B0223 Postherpetic polyneuropathy: Secondary | ICD-10-CM | POA: Diagnosis not present

## 2022-10-12 DIAGNOSIS — I10 Essential (primary) hypertension: Secondary | ICD-10-CM | POA: Diagnosis not present

## 2022-12-04 ENCOUNTER — Encounter: Payer: Self-pay | Admitting: Emergency Medicine

## 2022-12-04 ENCOUNTER — Ambulatory Visit
Admission: EM | Admit: 2022-12-04 | Discharge: 2022-12-04 | Disposition: A | Discharge status: Home / Self Care | Payer: 59 | Attending: Nurse Practitioner | Admitting: Nurse Practitioner

## 2022-12-04 DIAGNOSIS — R399 Unspecified symptoms and signs involving the genitourinary system: Secondary | ICD-10-CM | POA: Diagnosis not present

## 2022-12-04 DIAGNOSIS — B0229 Other postherpetic nervous system involvement: Secondary | ICD-10-CM | POA: Diagnosis not present

## 2022-12-04 DIAGNOSIS — R1084 Generalized abdominal pain: Secondary | ICD-10-CM | POA: Insufficient documentation

## 2022-12-04 DIAGNOSIS — K5900 Constipation, unspecified: Secondary | ICD-10-CM | POA: Diagnosis not present

## 2022-12-04 LAB — POCT URINALYSIS DIP (MANUAL ENTRY)
Bilirubin, UA: NEGATIVE
Glucose, UA: NEGATIVE mg/dL
Ketones, POC UA: NEGATIVE mg/dL
Nitrite, UA: NEGATIVE
Protein Ur, POC: 30 mg/dL — AB
Spec Grav, UA: 1.015 (ref 1.010–1.025)
Urobilinogen, UA: 1 E.U./dL
pH, UA: 7.5 (ref 5.0–8.0)

## 2022-12-04 MED ORDER — SENNOSIDES-DOCUSATE SODIUM 8.6-50 MG PO TABS: 1.0000 | ORAL_TABLET | Freq: Every day | ORAL | 0 refills | Status: DC

## 2022-12-04 MED ORDER — AMOXICILLIN-POT CLAVULANATE 875-125 MG PO TABS: 1.0000 | ORAL_TABLET | Freq: Two times a day (BID) | ORAL | 0 refills | Status: DC

## 2022-12-04 NOTE — ED Provider Notes (Signed)
[facility]    CSN: [id number] Arrival date & time: 12/04/22  1504      History   Chief Complaint No chief complaint on file.   HPI Ariana Carney is a 89 y.o. female.   The history is provided by a relative.   The patient presents with her daughter for complaints of constipation.  Patient's daughter states patient was eating breakfast this morning, and began to complain of severe abdominal pain.  They also state that the patient complained of nausea.  Patient's daughter states the patient's last bowel movement was approximately 2 to 3 days ago.  Patient's daughter believes patient is having a bowel movement every 2 to 3 days.  Patient's daughters deny fever, chills, chest pain, abdominal pain, vomiting, or diarrhea.  They state that the patient does not "drink enough water" and that she does not eat a lot of different foods.  They state that when she eats meat, she "sucks the juice out of the meat" and spits the meat out.  They do endorse that the patient's urine has a "foul-smelling odor".  They deny frequency, urgency, hesitancy, low back pain, or flank pain.  The patient's daughter states that the patient does not take any medication for constipation.  They are also concerned for pain that the patient is continuing to experience on her right side where she had shingles in the latter part of last year.  They state that the patient is constantly complaining of "a burning pain" to that site.  The patient's daughter denies new rash, swelling, exposure to new soaps, medications, foods, or detergents.  The patient's daughter states the patient is a patient of Dr. Ria Comment, and she currently is taking gabapentin for her neuropathic pain, as this is already being treated by Dr. Willey Blade.  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   Heart disease    Hypertension    Thyroid disease     Ulcer     Patient Active Problem List   Diagnosis Date Noted   Elevated troponin 123456   Acute metabolic encephalopathy 123456   UTI (urinary tract infection) 04/04/2022   GERD (gastroesophageal reflux disease) 04/04/2022   Hypothyroidism 04/04/2022   Hyponatremia 04/04/2022   Hypokalemia 04/04/2022   Chronic kidney disease, stage 3a ([place]) 04/04/2022   Opioid dependence ([place]) 04/04/2022   Generalized weakness 04/04/2022   Tobacco abuse 04/04/2022   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      Home Medications    Prior to Admission medications   Medication Sig Start Date End Date Taking? Authorizing Provider  amoxicillin-clavulanate (AUGMENTIN) 875-125 MG tablet Take 1 tablet by mouth every 12 (twelve) hours. 12/04/22  Yes Shivam Mestas-[place], Alda Lea, NP  lisinopril-hydrochlorothiazide (ZESTORETIC) 10-12.5 MG tablet Take 1 tablet by mouth daily.   Yes [provider]  mirtazapine (REMERON SOL-TAB) 15 MG disintegrating tablet Take 15 mg by mouth at bedtime.   Yes [provider]  oxybutynin (DITROPAN) 5 MG tablet Take 5 mg by mouth once.   Yes [provider]  senna-docusate (SENOKOT-S) 8.6-50  MG tablet Take 1 tablet by mouth at bedtime. 12/04/22  Yes Marcel Gary-[place], Alda Lea, NP  cefdinir (OMNICEF) 300 MG capsule Take 1 capsule (300 mg total) by mouth every 12 (twelve) hours. 04/06/22   Orson Eva, MD  famotidine (PEPCID) 20 MG tablet Take 1 tablet (20 mg total) by mouth 2 (two) times daily for 5 days. 05/20/21 04/04/22  [place], [place], PA-C  folic acid (FOLVITE) 1 MG tablet Take 1 tablet (1 mg total) by mouth daily. 04/06/22   Orson Eva, MD  gabapentin (NEURONTIN) 300 MG capsule Take 1  capsule (300 mg total) by mouth 2 (two) times daily. 04/05/22   Orson Eva, MD  levothyroxine (SYNTHROID, LEVOTHROID) 50 MCG tablet Take 50 mcg by mouth daily.      [provider]  vitamin B-12 (CYANOCOBALAMIN) 500 MCG tablet Take 1 tablet (500 mcg total) by mouth daily. 04/06/22   Orson Eva, MD    Family History History reviewed. No pertinent family history.  Social History Social History   Tobacco Use   Smoking status: Some Days    Types: Cigarettes   Smokeless tobacco: Never  Substance Use Topics   Alcohol use: No   Drug use: No     Allergies   Patient has no known allergies.   Review of Systems Review of Systems Per HPI  Physical Exam Triage Vital Signs ED Triage Vitals  Enc Vitals Group     BP 12/04/22 1517 106/61     Pulse Rate 12/04/22 1517 75     Resp 12/04/22 1517 20     Temp 12/04/22 1517 97.9 F (36.6 C)     Temp Source 12/04/22 1517 Oral     SpO2 12/04/22 1517 93 %     Weight --      Height --      Head Circumference --      Peak Flow --      Pain Score 12/04/22 1518 0     Pain Loc --      Pain Edu? --      Excl. in [place]? --    No data found.  Updated Vital Signs BP 106/61 (BP Location: Right Arm)   Pulse 75   Temp 97.9 F (36.6 C) (Oral)   Resp 20   SpO2 93%   Visual Acuity Right Eye Distance:   Left Eye Distance:   Bilateral Distance:    Right Eye Near:   Left Eye Near:    Bilateral Near:     Physical Exam Vitals and nursing note reviewed.  Constitutional:      General: She is not in acute distress. HENT:     Head: Normocephalic.  Eyes:     Extraocular Movements: Extraocular movements intact.     Pupils: Pupils are equal, round, and reactive to light.  Cardiovascular:     Rate and Rhythm: Normal rate and regular rhythm.     Pulses: Normal pulses.     Heart sounds: Normal heart sounds.  Pulmonary:     Effort: Pulmonary effort is normal. No respiratory distress.     Breath sounds: Normal breath sounds. No stridor.  No wheezing, rhonchi or rales.  Abdominal:     General: Bowel sounds are normal.     Palpations: Abdomen is soft.     Tenderness: There is no abdominal tenderness.  Musculoskeletal:     Cervical back: Normal range of motion.  Skin:    General: Skin is warm and dry.  Neurological:  General: No focal deficit present.     Mental Status: She is alert and oriented to person, place, and time.  Psychiatric:        Mood and Affect: Mood normal.        Behavior: Behavior normal.      UC Treatments / Results  Labs (all labs ordered are listed, but only abnormal results are displayed) Labs Reviewed  POCT URINALYSIS DIP (MANUAL ENTRY) - Abnormal; Notable for the following components:      Result Value   Clarity, UA cloudy (*)    Blood, UA trace-intact (*)    Protein Ur, POC =30 (*)    Leukocytes, UA Large (3+) (*)    All other components within normal limits  URINE CULTURE    EKG   Radiology No results found.  Procedures Procedures (including critical care time)  Medications Ordered in UC Medications - No data to display  Initial Impression / Assessment and Plan / UC Course  I have reviewed the triage vital signs and the nursing notes.  Pertinent labs & imaging results that were available during my care of the patient were reviewed by me and considered in my medical decision making (see chart for details).  The patient is well-appearing, she is in no acute distress, vital signs are stable.  Patient with multiple complaints.  With regard to her abdominal pain, symptoms appear to be most likely due to constipation, she has no abdominal tenderness, she is afebrile, and vital signs are stable.  Will treat patient's constipation with Senokot 8.6/50 mg tablets at bedtime.  For her postherpetic neuralgia, no changes were made to her medication regimen at this time, she is currently taking gabapentin 300 mg twice daily.  Advised the patient's daughter to follow-up with her primary  care physician for reevaluation of her medication management if symptoms continue to persist. Urinalysis was suggestive of a urinary tract infection with large leukocytes, protein, and blood.  Will treat with Augmentin 875/125 mg tablets for the next 7 days while culture is pending.  Patient's daughter was advised that if the culture result is negative, she will be contacted and asked to stop the antibiotic.  Supportive care recommendations were provided to the patient's daughter to include increasing her fluid intake, vegetable and fruit intake, and use of over-the-counter analgesics for pain or discomfort.  Daughters were also provided with strict indications of when going to the ER may be indicated.  Patient's daughters verbalized understanding, and are in agreement with this plan of care.  All questions were answered.  Patient is stable for discharge.   Final Clinical Impressions(s) / UC Diagnoses   Final diagnoses:  Generalized abdominal pain  Constipation, unspecified constipation type  Urinary tract infection symptoms  Postherpetic neuralgia     Discharge Instructions      The urinalysis suggest that she may have a urinary tract infection.  A urine culture has been ordered to confirm.  In the meantime, I am starting her on an antibiotic to treat a urinary tract infection.  If the urine culture result is negative, you will be contacted and asked to stop the antibiotic. I am also prescribing a medication to help with her constipation to take daily at bedtime. May administer Tylenol as needed for pain, fever, or general discomfort. It is important for her to drink plenty of fluids, peripherally be water, try to have her drink at least 5-6 8 ounce glasses of water daily to help with constipation and to prevent  dehydration. Have her eat at least 1 serving of fruits and vegetables at each meal time to help prevent further constipation. Continue her gabapentin currently prescribed.  If the  medication does not seem to be effective, please follow-up with Dr. Willey Blade for reevaluation. Go to the emergency department if she experiences worsening abdominal pain with new symptoms of nausea, vomiting, change in her mental status,     ED Prescriptions     Medication Sig Dispense Auth. Provider   amoxicillin-clavulanate (AUGMENTIN) 875-125 MG tablet Take 1 tablet by mouth every 12 (twelve) hours. 14 tablet Leyan Branden-Warren, Alda Lea, NP   senna-docusate (SENOKOT-S) 8.6-50 MG tablet Take 1 tablet by mouth at bedtime. 30 tablet Romyn Boswell-War[place], Alda Lea, NP      PDMP not reviewed this encounter.   Tish Men, NP 12/04/22 [phone]

## 2022-12-04 NOTE — ED Triage Notes (Signed)
Continues to have pain from shinges on right side (since November)  Felt nauseated this morning  States unable to have a bowel movement this morning.  States last BM was either Friday or Saturday.  States she doesn't feel nauseated now

## 2022-12-04 NOTE — Discharge Instructions (Addendum)
The urinalysis suggest that she may have a urinary tract infection.  A urine culture has been ordered to confirm.  In the meantime, I am starting her on an antibiotic to treat a urinary tract infection.  If the urine culture result is negative, you will be contacted and asked to stop the antibiotic. I am also prescribing a medication to help with her constipation to take daily at bedtime. May administer Tylenol as needed for pain, fever, or general discomfort. It is important for her to drink plenty of fluids, peripherally be water, try to have her drink at least 5-6 8 ounce glasses of water daily to help with constipation and to prevent dehydration. Have her eat at least 1 serving of fruits and vegetables at each meal time to help prevent further constipation. Continue her gabapentin currently prescribed.  If the medication does not seem to be effective, please follow-up with Dr. Willey Blade for reevaluation. Go to the emergency department if she experiences worsening abdominal pain with new symptoms of nausea, vomiting, change in her mental status,

## 2022-12-06 LAB — URINE CULTURE: Culture: >=100000 — AB

## 2022-12-12 ENCOUNTER — Ambulatory Visit
Admission: EM | Admit: 2022-12-12 | Discharge: 2022-12-12 | Disposition: A | Discharge status: Home / Self Care | Payer: 59 | Attending: Nurse Practitioner | Admitting: Nurse Practitioner

## 2022-12-12 DIAGNOSIS — B029 Zoster without complications: Secondary | ICD-10-CM

## 2022-12-12 MED ORDER — VALACYCLOVIR HCL 1 G PO TABS: 1000.0000 mg | ORAL_TABLET | Freq: Every day | ORAL | 0 refills | Status: AC

## 2022-12-12 NOTE — ED Triage Notes (Signed)
Per caregiver, pt has redness under left breast since this morning.

## 2022-12-12 NOTE — ED Provider Notes (Signed)
[facility]    CSN: [id number] Arrival date & time: 12/12/22  1015      History   Chief Complaint Chief Complaint  Patient presents with   Breast Problem    HPI Ariana Carney is a 89 y.o. female.   Patient presents today with daughter for red rash under left breast has been present since this morning.  Patient describes the rash as "burning."  Reports last year, she had a similar rash under the right breast that was later diagnosed with shingles and now has postherpetic neuralgia deals with burning daily.  Has not tried to take anything for the symptoms so far.  She denies oozing, drainage, fever, nausea/vomiting since the rash began.    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   Heart disease    Hypertension    Thyroid disease    Ulcer     Patient Active Problem List   Diagnosis Date Noted   Elevated troponin 123456   Acute metabolic encephalopathy 123456   UTI (urinary tract infection) 04/04/2022   GERD (gastroesophageal reflux disease) 04/04/2022   Hypothyroidism 04/04/2022   Hyponatremia 04/04/2022   Hypokalemia 04/04/2022   Chronic kidney disease, stage 3a ([place]) 04/04/2022   Opioid dependence ([place]) 04/04/2022   Generalized weakness 04/04/2022   Tobacco abuse 04/04/2022   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      Home Medications    Prior to Admission medications   Medication Sig Start Date End Date Taking? Authorizing Provider  valACYclovir (VALTREX) 1000 MG tablet Take 1 tablet (1,000 mg total) by mouth daily for 10 days. 12/12/22 12/22/22 Yes Eulogio Bear, NP   famotidine (PEPCID) 20 MG tablet Take 1 tablet (20 mg total) by mouth 2 (two) times daily for 5 days. 05/20/21 04/04/22  Wurst, [place], PA-C  folic acid (FOLVITE) 1 MG tablet Take 1 tablet (1 mg total) by mouth daily. 04/06/22   Orson Eva, MD  gabapentin (NEURONTIN) 300 MG capsule Take 1 capsule (300 mg total) by mouth 2 (two) times daily. 04/05/22   Orson Eva, MD  levothyroxine (SYNTHROID, LEVOTHROID) 50 MCG tablet Take 50 mcg by mouth daily.      [provider]  lisinopril-hydrochlorothiazide (ZESTORETIC) 10-12.5 MG tablet Take 1 tablet by mouth daily.    [provider]  mirtazapine (REMERON SOL-TAB) 15 MG disintegrating tablet Take 15 mg by mouth at bedtime.    [provider]  oxybutynin (DITROPAN) 5 MG tablet Take 5 mg by mouth once.    [provider]  senna-docusate (SENOKOT-S) 8.6-50 MG tablet Take 1 tablet by mouth at bedtime. 12/04/22   Leath-Warren, Alda Lea, NP  vitamin B-12 (CYANOCOBALAMIN) 500 MCG tablet Take 1 tablet (500 mcg total) by mouth daily. 04/06/22   Orson Eva, MD    Family History History reviewed. No pertinent family history.  Social History Social History   Tobacco Use   Smoking status: Some Days    Types: Cigarettes   Smokeless tobacco: Never  Substance Use Topics   Alcohol use: No   Drug  use: No     Allergies   Patient has no known allergies.   Review of Systems Review of Systems Per HPI  Physical Exam Triage Vital Signs ED Triage Vitals  Enc Vitals Group     BP 12/12/22 1236 (!) 155/65     Pulse Rate 12/12/22 1236 71     Resp 12/12/22 1236 18     Temp 12/12/22 1236 97.7 F (36.5 C)     Temp Source 12/12/22 1236 Oral     SpO2 12/12/22 1236 96 %     Weight --      Height --      Head Circumference --      Peak Flow --      Pain Score 12/12/22 1235 0     Pain Loc --      Pain Edu? --      Excl. in [place]? --    No data found.  Updated Vital Signs BP (!) 155/65 (BP Location: Right Arm)   Pulse 71    Temp 97.7 F (36.5 C) (Oral)   Resp 18   SpO2 96%   Visual Acuity Right Eye Distance:   Left Eye Distance:   Bilateral Distance:    Right Eye Near:   Left Eye Near:    Bilateral Near:     Physical Exam Vitals and nursing note reviewed.  Constitutional:      General: She is not in acute distress.    Appearance: Normal appearance. She is not toxic-appearing.  HENT:     Mouth/Throat:     Mouth: Mucous membranes are moist.     Pharynx: Oropharynx is clear.  Pulmonary:     Effort: Pulmonary effort is normal. No respiratory distress.  Musculoskeletal:     Cervical back: Normal range of motion.  Lymphadenopathy:     Cervical: No cervical adenopathy.  Skin:    General: Skin is warm and dry.     Capillary Refill: Capillary refill takes less than 2 seconds.     Findings: Erythema and rash present. Rash is vesicular.          Comments: Slightly erythematous rash under left breast with 1 blister.  No oozing, drainage, or odor.   Neurological:     Mental Status: She is alert and oriented to person, place, and time.  Psychiatric:        Behavior: Behavior is cooperative.      UC Treatments / Results  Labs (all labs ordered are listed, but only abnormal results are displayed) Labs Reviewed - No data to display  EKG   Radiology No results found.  Procedures Procedures (including critical care time)  Medications Ordered in UC Medications - No data to display  Initial Impression / Assessment and Plan / UC Course  I have reviewed the triage vital signs and the nursing notes.  Pertinent labs & imaging results that were available during my care of the patient were reviewed by me and considered in my medical decision making (see chart for details).   Patient is well-appearing, normotensive, afebrile, not tachycardic, not tachypneic, oxygenating well on room air.    1. Herpes zoster without complication Suspect shingles  Crcl 20 based on recent creatinine of  1.2 Treat with Valtrex dosed based on kidney function - 1 g daily for 10 days Wound care discussed ER and return precautions discussed with patient and daughter  The patient was given the opportunity to ask questions.  All questions answered to their satisfaction.  The patient is in agreement to this plan.    Final Clinical Impressions(s) / UC Diagnoses   Final diagnoses:  Herpes zoster without complication     Discharge Instructions      I believe you have shingles under your left breast.  Take the Valtrex to stop new lesions from coming up.  Please apply a nonadherent bandage over the area if the blister opens up and starts draining to prevent it from spreading.      ED Prescriptions     Medication Sig Dispense Auth. Provider   valACYclovir (VALTREX) 1000 MG tablet Take 1 tablet (1,000 mg total) by mouth daily for 10 days. 10 tablet Eulogio Bear, NP      PDMP not reviewed this encounter.   Eulogio Bear, NP 12/12/22 [phone]

## 2022-12-12 NOTE — Discharge Instructions (Signed)
I believe you have shingles under your left breast.  Take the Valtrex to stop new lesions from coming up.  Please apply a nonadherent bandage over the area if the blister opens up and starts draining to prevent it from spreading.

## 2023-02-19 DIAGNOSIS — M1990 Unspecified osteoarthritis, unspecified site: Secondary | ICD-10-CM | POA: Diagnosis not present

## 2023-02-19 DIAGNOSIS — B0223 Postherpetic polyneuropathy: Secondary | ICD-10-CM | POA: Diagnosis not present

## 2023-02-19 DIAGNOSIS — I10 Essential (primary) hypertension: Secondary | ICD-10-CM | POA: Diagnosis not present

## 2023-03-16 IMAGING — DX DG CHEST 2V
2 series · 2 of 2 positions shown · non-contrast
Comparison: Chest radiograph dated 10/18/2021.

CLINICAL DATA: Cough.

EXAM:
CHEST - 2 VIEW

[chest lat]
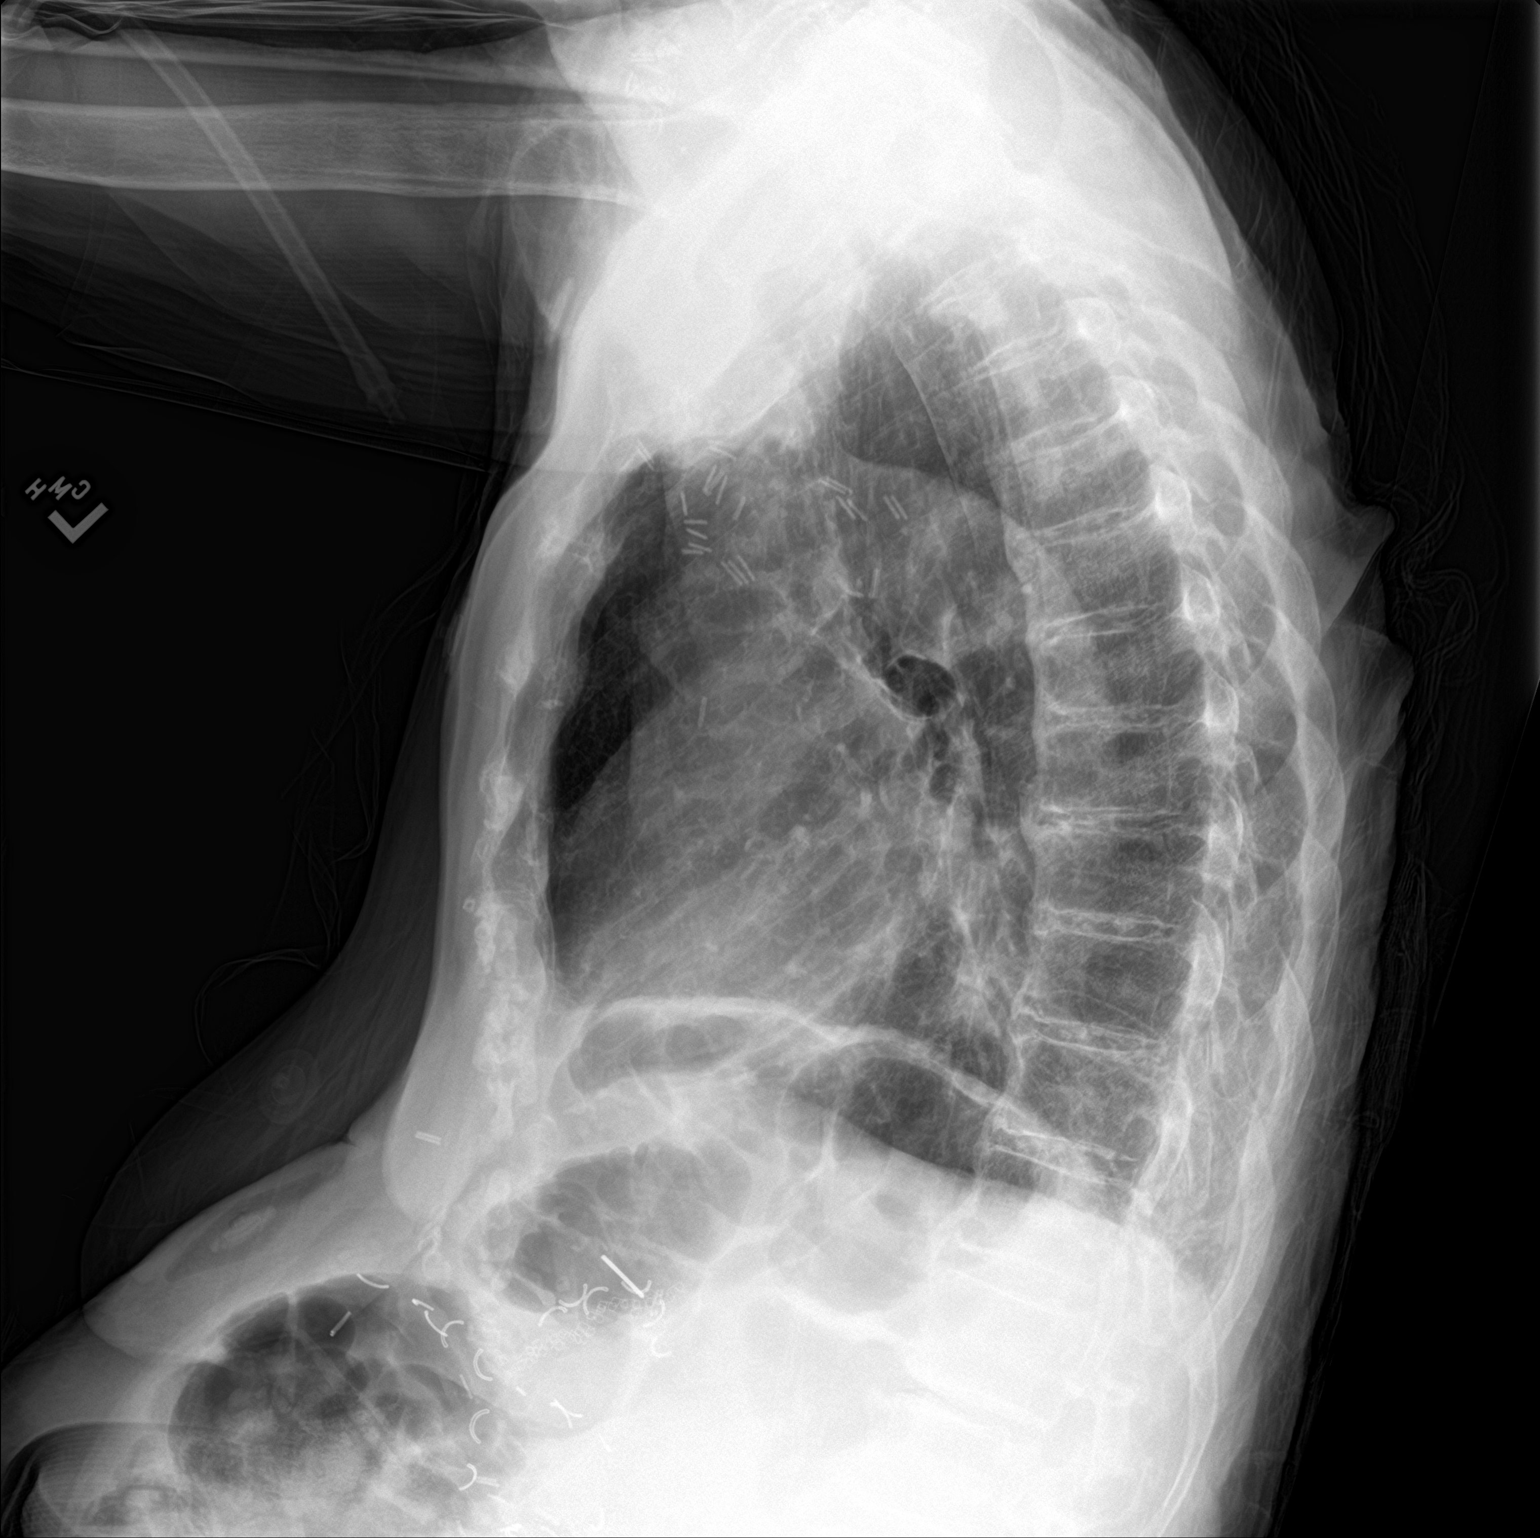

[chest ap]
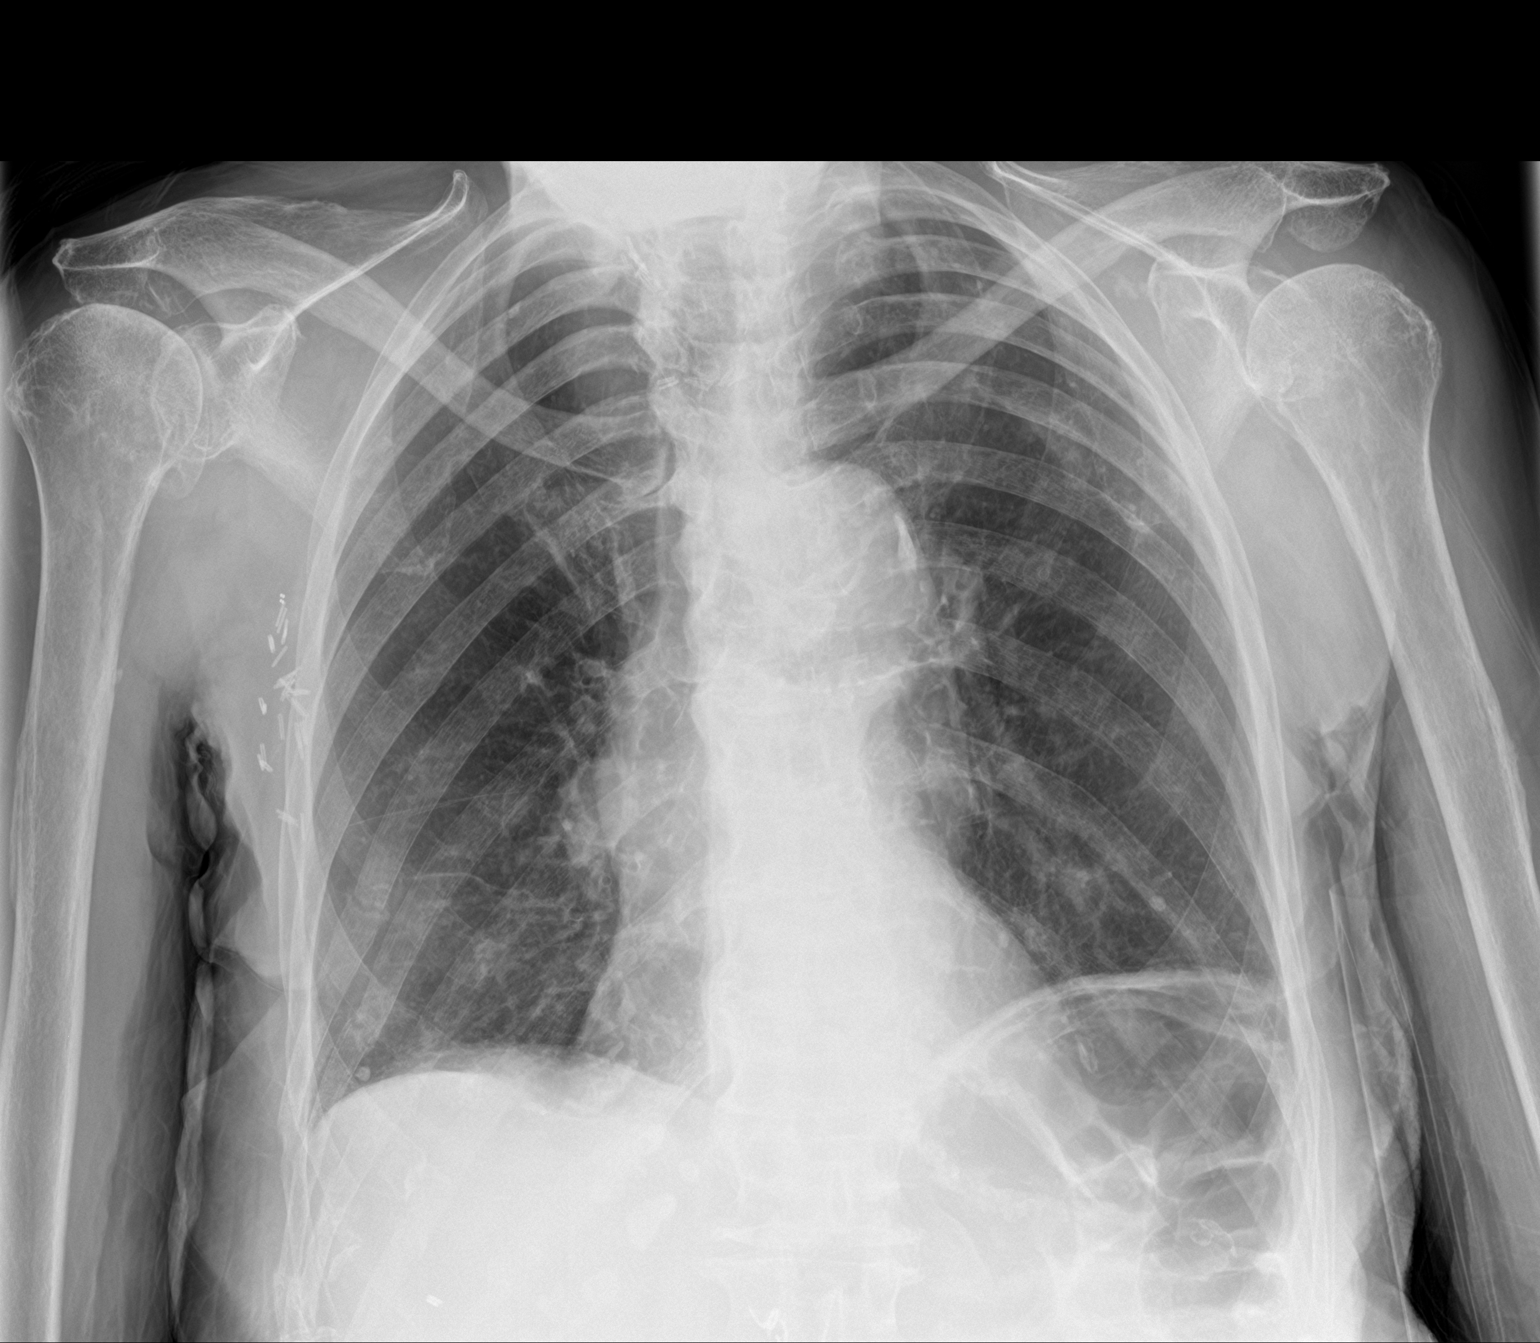

[2 of 2 positions shown; findings below may reference images not displayed]

FINDINGS: No focal consolidation, pleural effusion, or pneumothorax. The
cardiac silhouette is within limits. Atherosclerotic calcification
of the aorta. Osteopenia degenerative changes of the spine. Right
chest wall surgical clips. No acute osseous pathology.
IMPRESSION: No active cardiopulmonary disease.

## 2023-03-31 ENCOUNTER — Encounter: Payer: Self-pay | Admitting: Emergency Medicine

## 2023-03-31 ENCOUNTER — Ambulatory Visit
Admission: EM | Admit: 2023-03-31 | Discharge: 2023-03-31 | Disposition: A | Discharge status: Home / Self Care | Payer: 59 | Attending: Nurse Practitioner | Admitting: Nurse Practitioner

## 2023-03-31 DIAGNOSIS — R8290 Unspecified abnormal findings in urine: Secondary | ICD-10-CM | POA: Diagnosis not present

## 2023-03-31 DIAGNOSIS — T783XXA Angioneurotic edema, initial encounter: Secondary | ICD-10-CM | POA: Diagnosis not present

## 2023-03-31 LAB — POCT URINALYSIS DIP (MANUAL ENTRY)
Bilirubin, UA: NEGATIVE
Glucose, UA: NEGATIVE mg/dL
Ketones, POC UA: NEGATIVE mg/dL
Nitrite, UA: NEGATIVE
Protein Ur, POC: NEGATIVE mg/dL
Spec Grav, UA: 1.02 (ref 1.010–1.025)
Urobilinogen, UA: 1 E.U./dL
pH, UA: 7 (ref 5.0–8.0)

## 2023-03-31 MED ORDER — DEXAMETHASONE SODIUM PHOSPHATE 10 MG/ML IJ SOLN
10.0000 mg | INTRAMUSCULAR | Status: AC
Start: 2023-03-31 — End: 2023-03-31
  Administered 2023-03-31: 10 mg via INTRAMUSCULAR

## 2023-03-31 MED ORDER — PREDNISONE 20 MG PO TABS: 20.0000 mg | ORAL_TABLET | Freq: Every day | ORAL | 0 refills | Status: AC

## 2023-03-31 MED ORDER — CEPHALEXIN 500 MG PO CAPS: 500.0000 mg | ORAL_CAPSULE | Freq: Two times a day (BID) | ORAL | 0 refills | Status: AC

## 2023-03-31 NOTE — ED Triage Notes (Signed)
Woke up this morning with swollen lips.  Daughter states patient had a syncopal episode yesterday and EMS was called.  Patient refused to be transported anywhere.

## 2023-03-31 NOTE — Discharge Instructions (Signed)
For lip swelling: Continue to monitor for worsening symptoms such as tongue swelling or throat swelling, if so, please go to the emergency department immediately for further evaluation. Administer Benadryl as needed for continued swelling of the lips. May apply cool compresses to the lips to help with swelling. I recommend speaking with the patient's physician regarding lisinopril lisinopril being the possible treatment trigger of patient's angioedema.  For the UTI: The urinalysis does show leukocytes and blood.  Urine culture has been ordered.  If the results of the urine culture are positive, you will be contacted. Administer medication as prescribed.  If the results of the urine culture are negative, you will be advised to stop the antibiotic. Increase her fluid intake.  Try to have her drink at least 4-5 8 ounce glasses of water daily to prevent reoccurring urinary tract infections. Tylenol for pain or discomfort. If she develops worsening confusion, fever, chills, or other concerns, please go to the emergency department immediately for further evaluation.  Follow-up as needed.

## 2023-03-31 NOTE — ED Provider Notes (Signed)
[facility]    CSN: [id number] Arrival date & time: 03/31/23  1237      History   Chief Complaint No chief complaint on file.   HPI Ariana Carney is a 89 y.o. female.   The history is provided by the patient and a relative (Daughter).   The patient was brought in by her daughter for complaints of lip swelling.  Patient's daughter states symptoms started this morning when she woke up.  Patient's daughter states this has happened in the past, and all she has to do is give her Benadryl, but daughter states she did not have any.  Patient's daughter denies any new exposures to foods, soaps, medications, or detergents.  She states they thought the patient was allergic to shrimp when it happened previously, but she did not have strep last evening.  Patient's daughter denies shortness of breath, difficulty breathing, trouble swallowing, regurgitation, wheezing, chest pain, abdominal pain, nausea, vomiting, or diarrhea.  Patient's daughter states that the patient does take lisinopril for her blood pressure.  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   Heart disease    Hypertension    Thyroid disease    Ulcer     Patient Active Problem List   Diagnosis Date Noted   Elevated troponin 04/04/2022   Acute metabolic encephalopathy 04/04/2022   UTI (urinary tract infection) 04/04/2022   GERD (gastroesophageal reflux disease) 04/04/2022   Hypothyroidism 04/04/2022   Hyponatremia 04/04/2022   Hypokalemia 04/04/2022   Chronic kidney disease, stage 3a ([facility]) 04/04/2022   Opioid dependence ([facility]) 04/04/2022   Generalized weakness 04/04/2022   Tobacco abuse 04/04/2022   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      Home Medications    Prior to Admission medications   Medication Sig Start Date End Date Taking? Authorizing Provider  cephALEXin (KEFLEX) 500 MG capsule Take 1 capsule (500 mg total) by mouth 2 (two) times daily for 7 days. 03/31/23 04/07/23 Yes Nyeemah Jennette-Warren, Sadie Haber, NP  predniSONE (DELTASONE) 20 MG tablet Take 1 tablet (20 mg total) by mouth daily with breakfast for 5 days. 03/31/23 04/05/23 Yes Emmalynn Pinkham-Warren, Sadie Haber, NP  famotidine (PEPCID) 20 MG tablet Take 1 tablet (20 mg total) by mouth 2 (two) times daily for 5 days. 05/20/21 04/04/22  Wurst, [place], PA-C  folic acid (FOLVITE) 1 MG tablet Take 1 tablet (1 mg total) by mouth daily. 04/06/22   Catarina Hartshorn, MD  gabapentin (NEURONTIN) 300 MG capsule Take 1 capsule (300 mg total) by mouth 2 (two) times daily. 04/05/22   Catarina Hartshorn, MD  levothyroxine (SYNTHROID, LEVOTHROID) 50 MCG tablet Take 50 mcg by mouth daily.      [provider]  lisinopril-hydrochlorothiazide (ZESTORETIC) 10-12.5 MG tablet Take 1 tablet by mouth daily.    [provider]  mirtazapine (REMERON SOL-TAB) 15 MG disintegrating tablet Take 15 mg by mouth at bedtime.    [provider]  oxybutynin (DITROPAN) 5 MG tablet Take 5 mg by mouth once.    [provider]  senna-docusate (SENOKOT-S) 8.6-50 MG tablet  Take 1 tablet by mouth at bedtime. 12/04/22   Roczen Waymire-Warren, Sadie Haber, NP  vitamin B-12 (CYANOCOBALAMIN) 500 MCG tablet Take 1 tablet (500 mcg total) by mouth daily. 04/06/22   Catarina Hartshorn, MD    Family History History reviewed. No pertinent family history.  Social History Social History   Tobacco Use   Smoking status: Some Days    Types: Cigarettes   Smokeless tobacco: Never  Substance Use Topics   Alcohol use: No   Drug use: No     Allergies   Shrimp (diagnostic)   Review of Systems Review of Systems Per HPI  Physical  Exam Triage Vital Signs ED Triage Vitals [03/31/23 1303]  Enc Vitals Group     BP (!) 144/71     Pulse Rate 73     Resp 20     Temp 97.8 F (36.6 C)     Temp Source Oral     SpO2 98 %     Weight      Height      Head Circumference      Peak Flow      Pain Score 0     Pain Loc      Pain Edu?      Excl. in [place]?    No data found.  Updated Vital Signs BP (!) 144/71 (BP Location: Right Arm)   Pulse 73   Temp 97.8 F (36.6 C) (Oral)   Resp 20   SpO2 98%   Visual Acuity Right Eye Distance:   Left Eye Distance:   Bilateral Distance:    Right Eye Near:   Left Eye Near:    Bilateral Near:     Physical Exam Vitals and nursing note reviewed.  Constitutional:      General: She is not in acute distress.    Appearance: Normal appearance.  HENT:     Head: Normocephalic.     Nose: Nose normal.     Mouth/Throat:     Lips: Pink.     Mouth: Mucous membranes are moist. Angioedema (Swelling of upper and lower lips) present.     Comments: Airway is clear and patent, no obstruction present.  Patient is speaking in complete sentences. Eyes:     Extraocular Movements: Extraocular movements intact.     Conjunctiva/sclera: Conjunctivae normal.     Pupils: Pupils are equal, round, and reactive to light.  Cardiovascular:     Rate and Rhythm: Normal rate and regular rhythm.     Pulses: Normal pulses.     Heart sounds: Normal heart sounds.  Pulmonary:     Effort: Pulmonary effort is normal. No respiratory distress.     Breath sounds: Normal breath sounds. No stridor. No wheezing, rhonchi or rales.  Abdominal:     General: Bowel sounds are normal.     Palpations: Abdomen is soft.     Tenderness: There is no abdominal tenderness.  Musculoskeletal:     Cervical back: Normal range of motion.  Lymphadenopathy:     Cervical: No cervical adenopathy.  Skin:    General: Skin is warm and dry.  Neurological:     General: No focal deficit present.     Mental Status: She is alert.      Comments: Alert and oriented to self and family.  Psychiatric:        Mood and Affect: Mood normal.        Behavior: Behavior normal.      UC Treatments / Results  Labs (all labs ordered are listed, but only abnormal results are displayed) Labs Reviewed  POCT URINALYSIS DIP (MANUAL ENTRY) - Abnormal; Notable for the following components:      Result Value   Clarity, UA hazy (*)    Blood, UA small (*)    Leukocytes, UA Large (3+) (*)    All other components within normal limits  URINE CULTURE    EKG   Radiology No results found.  Procedures Procedures (including critical care time)  Medications Ordered in UC Medications  dexamethasone (DECADRON) injection 10 mg (10 mg Intramuscular Given 03/31/23 1346)    Initial Impression / Assessment and Plan / UC Course  I have reviewed the triage vital signs and the nursing notes.  Pertinent labs & imaging results that were available during my care of the patient were reviewed by me and considered in my medical decision making (see chart for details).  The patient presents with angioedema that started earlier today.  Airway is clear and patent, no tongue or throat swelling, patient is in no acute acute distress, and is speaking in complete sentences.  Suspect that angioedema may be caused by patient's lisinopril.  Patient's daughter was advised to speak to the patient's primary care physician regarding this concern as there appears to be no obvious trigger for her symptoms.  Patient was given Decadron 10 mg IM for angioedema.  Patient was started on prednisone 20 mg for the next 5 days.  With regard to her urinalysis, it is positive for leukocytes and blood.  Will treat empirically with Keflex 500 mg twice daily for the next 7 days, urine culture is pending.  Patient's family was given supportive care recommendations to include Benadryl as needed for continued cold lips swelling, cool compresses to the lips to help with swelling, and to  continue to monitor for throat or tongue swelling.  Also recommend increasing her fluid intake, over-the-counter analgesics for pain or discomfort, along with strict ER follow-up precautions.  Patient's daughter is in agreement with this plan of care and verbalizes understanding.  All questions were answered.  Patient stable for discharge.   Final Clinical Impressions(s) / UC Diagnoses   Final diagnoses:  Angioedema, initial encounter  Abnormal urinalysis     Discharge Instructions      For lip swelling: Continue to monitor for worsening symptoms such as tongue swelling or throat swelling, if so, please go to the emergency department immediately for further evaluation. Administer Benadryl as needed for continued swelling of the lips. May apply cool compresses to the lips to help with swelling. I recommend speaking with the patient's physician regarding lisinopril lisinopril being the possible treatment trigger of patient's angioedema.  For the UTI: The urinalysis does show leukocytes and blood.  Urine culture has been ordered.  If the results of the urine culture are positive, you will be contacted. Administer medication as prescribed.  If the results of the urine culture are negative, you will be advised to stop the antibiotic. Increase her fluid intake.  Try to have her drink at least 4-5 8 ounce glasses of water daily to prevent reoccurring urinary tract infections. Tylenol for pain or discomfort. If she develops worsening confusion, fever, chills, or other concerns, please go to the emergency department immediately for further evaluation.  Follow-up as needed.     ED Prescriptions     Medication Sig Dispense Auth. Provider   predniSONE (DELTASONE) 20 MG tablet Take 1 tablet (20 mg total) by mouth daily with breakfast  for 5 days. 5 tablet Shandie Bertz-Warren, Sadie Haber, NP   cephALEXin (KEFLEX) 500 MG capsule Take 1 capsule (500 mg total) by mouth 2 (two) times daily for 7 days. 14  capsule Gevin Perea-Warren, Sadie Haber, NP      PDMP not reviewed this encounter.   Abran Cantor, NP 03/31/23 1401

## 2023-04-02 LAB — URINE CULTURE: Culture: >=100000 — AB

## 2023-04-26 ENCOUNTER — Encounter (HOSPITAL_COMMUNITY): Payer: Self-pay | Admitting: Emergency Medicine

## 2023-04-26 ENCOUNTER — Other Ambulatory Visit: Payer: Self-pay

## 2023-04-26 ENCOUNTER — Emergency Department (HOSPITAL_COMMUNITY)
Admission: EM | Admit: 2023-04-26 | Discharge: 2023-04-26 | Disposition: A | Discharge status: Home / Self Care | Payer: 59 | Attending: Emergency Medicine | Admitting: Emergency Medicine

## 2023-04-26 ENCOUNTER — Emergency Department (HOSPITAL_COMMUNITY): Payer: 59

## 2023-04-26 DIAGNOSIS — I10 Essential (primary) hypertension: Secondary | ICD-10-CM | POA: Diagnosis not present

## 2023-04-26 DIAGNOSIS — F0390 Unspecified dementia without behavioral disturbance: Secondary | ICD-10-CM | POA: Insufficient documentation

## 2023-04-26 DIAGNOSIS — S0083XA Contusion of other part of head, initial encounter: Secondary | ICD-10-CM | POA: Diagnosis not present

## 2023-04-26 DIAGNOSIS — S0990XA Unspecified injury of head, initial encounter: Secondary | ICD-10-CM | POA: Insufficient documentation

## 2023-04-26 DIAGNOSIS — R1900 Intra-abdominal and pelvic swelling, mass and lump, unspecified site: Secondary | ICD-10-CM | POA: Diagnosis not present

## 2023-04-26 DIAGNOSIS — W19XXXA Unspecified fall, initial encounter: Secondary | ICD-10-CM

## 2023-04-26 DIAGNOSIS — Z79899 Other long term (current) drug therapy: Secondary | ICD-10-CM | POA: Insufficient documentation

## 2023-04-26 DIAGNOSIS — R9082 White matter disease, unspecified: Secondary | ICD-10-CM | POA: Diagnosis not present

## 2023-04-26 DIAGNOSIS — M25552 Pain in left hip: Secondary | ICD-10-CM | POA: Diagnosis not present

## 2023-04-26 DIAGNOSIS — W06XXXA Fall from bed, initial encounter: Secondary | ICD-10-CM | POA: Diagnosis not present

## 2023-04-26 DIAGNOSIS — I6381 Other cerebral infarction due to occlusion or stenosis of small artery: Secondary | ICD-10-CM | POA: Diagnosis not present

## 2023-04-26 NOTE — Discharge Instructions (Addendum)
You were seen after your fall in the emergency department.   At home, please use ice around your eye and Tylenol for any aches or pains that you have.    Check your MyChart online for the results of any tests that had not resulted by the time you left the emergency department.   Follow-up with your primary doctor in 2-3 days regarding your visit.  Please talk to them about the pelvic mass that was seen on your CT which could be cancerous.  Return immediately to the emergency department if you experience any of the following: Severe pain, or any other concerning symptoms.    Thank you for visiting our Emergency Department. It was a pleasure taking care of you today.

## 2023-04-26 NOTE — ED Triage Notes (Signed)
Pt via POV with daughter reporting that pt fell from the bed and hit the left side of her face and head on the way down. Pt has swelling and bruising noted to left eye. She also reports pain to left hip. Hx HTN, thyroid disease, and breast cancer with right-sided arm restriction. No thinners. No LOC.

## 2023-04-26 NOTE — ED Provider Notes (Signed)
[place] EMERGENCY DEPARTMENT AT [facility] Provider Note   CSN: [id number] Arrival date & time: 04/26/23  1048     History  Chief Complaint  Patient presents with   Marletta Lor    Ariana Carney is a 89 y.o. female.  89 year old female with history of dementia who presents to the emergency department with head trauma after a fall.  Patient rolled out of bed this morning and struck her head on the way down.  Had some swelling around the left side of her eye that is gotten worse so her family brought her in for evaluation.  Has been ambulatory since but has been complaining of some pain in her hips initially said left side and right side.  History is limited due to the patient's dementia.  Patient not on blood thinners at all.  Not complaining of pain elsewhere at this time from the fall.       Home Medications Prior to Admission medications   Medication Sig Start Date End Date Taking? Authorizing Provider  famotidine (PEPCID) 20 MG tablet Take 1 tablet (20 mg total) by mouth 2 (two) times daily for 5 days. 05/20/21 04/04/22  Wurst, [place], PA-C  folic acid (FOLVITE) 1 MG tablet Take 1 tablet (1 mg total) by mouth daily. 04/06/22   Catarina Hartshorn, MD  gabapentin (NEURONTIN) 300 MG capsule Take 1 capsule (300 mg total) by mouth 2 (two) times daily. 04/05/22   Catarina Hartshorn, MD  levothyroxine (SYNTHROID, LEVOTHROID) 50 MCG tablet Take 50 mcg by mouth daily.      [provider]  lisinopril-hydrochlorothiazide (ZESTORETIC) 10-12.5 MG tablet Take 1 tablet by mouth daily.    [provider]  mirtazapine (REMERON SOL-TAB) 15 MG disintegrating tablet Take 15 mg by mouth at bedtime.    [provider]  oxybutynin (DITROPAN) 5 MG tablet Take 5 mg by mouth once.    [provider]  senna-docusate (SENOKOT-S) 8.6-50 MG tablet Take 1 tablet by mouth at bedtime. 12/04/22   Leath-Warren, Sadie Haber, NP  vitamin B-12 (CYANOCOBALAMIN) 500 MCG tablet Take 1 tablet  (500 mcg total) by mouth daily. 04/06/22   Catarina Hartshorn, MD      Allergies    Shrimp (diagnostic)    Review of Systems   Review of Systems  Physical Exam Updated Vital Signs BP 100/60   Pulse 70   Temp 98.5 F (36.9 C) (Oral)   Resp 12   Ht 5\' 1"  (1.549 m)   Wt 54 kg   SpO2 98%   BMI 22.48 kg/m  Physical Exam Constitutional:      General: She is not in acute distress.    Appearance: Normal appearance. She is not ill-appearing.  HENT:     Head: Normocephalic.     Comments: Left-sided periorbital ecchymoses    Right Ear: External ear normal.     Left Ear: External ear normal.     Mouth/Throat:     Mouth: Mucous membranes are moist.     Pharynx: Oropharynx is clear.  Eyes:     Extraocular Movements: Extraocular movements intact.     Conjunctiva/sclera: Conjunctivae normal.     Pupils: Pupils are equal, round, and reactive to light.  Neck:     Comments: No C-spine midline tenderness to palpation Cardiovascular:     Pulses: Normal pulses.  Pulmonary:     Effort: Pulmonary effort is normal. No respiratory distress.     Breath sounds: Normal breath sounds.  Abdominal:  General: Abdomen is flat.     Palpations: Abdomen is soft.     Tenderness: There is no abdominal tenderness. There is no guarding.  Musculoskeletal:        General: No deformity. Normal range of motion.     Cervical back: No rigidity or tenderness.     Comments: No tenderness to palpation of midline thoracic or lumbar spine.  No step-offs palpated.  No tenderness to palpation of chest wall.  No bruising noted.  No tenderness to palpation of bilateral clavicles.  No tenderness to palpation, bruising, or deformities noted of bilateral shoulders, elbows, wrists, hips, knees, or ankles.  Neurological:     General: No focal deficit present.     Mental Status: She is alert. Mental status is at baseline.     Cranial Nerves: No cranial nerve deficit.     Sensory: No sensory deficit.     Motor: No weakness.      ED Results / Procedures / Treatments   Labs (all labs ordered are listed, but only abnormal results are displayed) Labs Reviewed - No data to display  EKG None  Radiology CT PELVIS WO CONTRAST  Result Date: 04/26/2023 CLINICAL DATA:  Fall from bed with left hip pain EXAM: CT PELVIS WITHOUT CONTRAST TECHNIQUE: Multidetector CT imaging of the pelvis was performed following the standard protocol without intravenous contrast. RADIATION DOSE REDUCTION: This exam was performed according to the departmental dose-optimization program which includes automated exposure control, adjustment of the mA and/or kV according to patient size and/or use of iterative reconstruction technique. COMPARISON:  Right hip radiographs dated 01/09/2022 FINDINGS: Urinary Tract: No focal bladder wall thickening. Bowel: Partially imaged bowel without bowel wall thickening, distention, or inflammatory changes. Vascular/Lymphatic: Aortic atherosclerosis. No enlarged abdominal or pelvic lymph nodes. Reproductive: Uterus is reportedly surgically absent. Lobulated, heterogeneous mass centered within the pelvis measures 15.3 x 9.1 cm (5:51). The mass is discrete from the posterior bladder wall and is inseparable from the anterior rectosigmoid colon. The ovaries are not discretely seen. Other: Small volume pelvic free fluid.  No free air. Musculoskeletal: Technically challenging examination due to motion artifact and diffuse osteopenia. No acute or abnormal lytic or blastic osseous lesions. IMPRESSION: 1. Technically challenging examination due to motion artifact and diffuse osteopenia. No acute fracture or traumatic malalignment. 2. Lobulated, heterogeneous mass centered within the pelvis measures 15.3 x 9.1 cm and is inseparable from the anterior rectosigmoid colon. The ovaries are not discretely seen. Uterus is reportedly surgically absent. Differential is primarily suspicious for gynecologic tumor/malignancy. Recommend subspecialty  consultation to Gynecologic Oncology. 3.  Aortic Atherosclerosis (ICD10-I70.0). Electronically Signed   By: Agustin Cree M.D.   On: 04/26/2023 13:05   CT Head Wo Contrast  Result Date: 04/26/2023 CLINICAL DATA:  Fall from bed EXAM: CT HEAD WITHOUT CONTRAST CT MAXILLOFACIAL WITHOUT CONTRAST CT CERVICAL SPINE WITHOUT CONTRAST TECHNIQUE: Multidetector CT imaging of the head, cervical spine, and maxillofacial structures were performed using the standard protocol without intravenous contrast. Multiplanar CT image reconstructions of the cervical spine and maxillofacial structures were also generated. RADIATION DOSE REDUCTION: This exam was performed according to the departmental dose-optimization program which includes automated exposure control, adjustment of the mA and/or kV according to patient size and/or use of iterative reconstruction technique. COMPARISON:  01/09/2022 FINDINGS: CT HEAD FINDINGS Brain: No evidence of acute infarction, hemorrhage, hydrocephalus, extra-axial collection or mass lesion/mass effect. Periventricular white matter hypodensity. Small lacunar infarctions of the basal ganglia. Vascular: No hyperdense vessel or unexpected calcification. CT  FACIAL BONES FINDINGS Examination of the facial bones is significantly limited by motion artifact. Skull: Normal. Negative for fracture or focal lesion. Facial bones: No displaced fractures or dislocations. Sinuses/Orbits: No acute finding. Other: Soft tissue laceration and hematoma of the forehead (series 3, image 11). Patient is edentulous. CT CERVICAL SPINE FINDINGS Examination of the cervical spine is significantly limited by motion artifact. Alignment: Normal. Skull base and vertebrae: No acute fracture. No primary bone lesion or focal pathologic process. Soft tissues and spinal canal: No prevertebral fluid or swelling. No visible canal hematoma. Disc levels: Moderate multilevel disc space height loss and osteophytosis. Upper chest: Negative. Other:  None. IMPRESSION: 1. No acute intracranial pathology. Small-vessel white matter disease and small lacunar infarctions of the basal ganglia. 2. Examination of the facial bones is significantly limited by motion artifact. Within this limitation, no displaced fractures or dislocations of the facial bones. 3. Soft tissue laceration and hematoma of the forehead. 4. Examination of the cervical spine is significantly limited by motion artifact. Within this limitation, no fracture or static subluxation. 5. Moderate multilevel cervical disc degenerative disease. Electronically Signed   By: Jearld Lesch M.D.   On: 04/26/2023 12:53   CT Maxillofacial Wo Contrast  Result Date: 04/26/2023 CLINICAL DATA:  Fall from bed EXAM: CT HEAD WITHOUT CONTRAST CT MAXILLOFACIAL WITHOUT CONTRAST CT CERVICAL SPINE WITHOUT CONTRAST TECHNIQUE: Multidetector CT imaging of the head, cervical spine, and maxillofacial structures were performed using the standard protocol without intravenous contrast. Multiplanar CT image reconstructions of the cervical spine and maxillofacial structures were also generated. RADIATION DOSE REDUCTION: This exam was performed according to the departmental dose-optimization program which includes automated exposure control, adjustment of the mA and/or kV according to patient size and/or use of iterative reconstruction technique. COMPARISON:  01/09/2022 FINDINGS: CT HEAD FINDINGS Brain: No evidence of acute infarction, hemorrhage, hydrocephalus, extra-axial collection or mass lesion/mass effect. Periventricular white matter hypodensity. Small lacunar infarctions of the basal ganglia. Vascular: No hyperdense vessel or unexpected calcification. CT FACIAL BONES FINDINGS Examination of the facial bones is significantly limited by motion artifact. Skull: Normal. Negative for fracture or focal lesion. Facial bones: No displaced fractures or dislocations. Sinuses/Orbits: No acute finding. Other: Soft tissue laceration and  hematoma of the forehead (series 3, image 11). Patient is edentulous. CT CERVICAL SPINE FINDINGS Examination of the cervical spine is significantly limited by motion artifact. Alignment: Normal. Skull base and vertebrae: No acute fracture. No primary bone lesion or focal pathologic process. Soft tissues and spinal canal: No prevertebral fluid or swelling. No visible canal hematoma. Disc levels: Moderate multilevel disc space height loss and osteophytosis. Upper chest: Negative. Other: None. IMPRESSION: 1. No acute intracranial pathology. Small-vessel white matter disease and small lacunar infarctions of the basal ganglia. 2. Examination of the facial bones is significantly limited by motion artifact. Within this limitation, no displaced fractures or dislocations of the facial bones. 3. Soft tissue laceration and hematoma of the forehead. 4. Examination of the cervical spine is significantly limited by motion artifact. Within this limitation, no fracture or static subluxation. 5. Moderate multilevel cervical disc degenerative disease. Electronically Signed   By: Jearld Lesch M.D.   On: 04/26/2023 12:53   CT Cervical Spine Wo Contrast  Result Date: 04/26/2023 CLINICAL DATA:  Fall from bed EXAM: CT HEAD WITHOUT CONTRAST CT MAXILLOFACIAL WITHOUT CONTRAST CT CERVICAL SPINE WITHOUT CONTRAST TECHNIQUE: Multidetector CT imaging of the head, cervical spine, and maxillofacial structures were performed using the standard protocol without intravenous contrast. Multiplanar CT image  reconstructions of the cervical spine and maxillofacial structures were also generated. RADIATION DOSE REDUCTION: This exam was performed according to the departmental dose-optimization program which includes automated exposure control, adjustment of the mA and/or kV according to patient size and/or use of iterative reconstruction technique. COMPARISON:  01/09/2022 FINDINGS: CT HEAD FINDINGS Brain: No evidence of acute infarction, hemorrhage,  hydrocephalus, extra-axial collection or mass lesion/mass effect. Periventricular white matter hypodensity. Small lacunar infarctions of the basal ganglia. Vascular: No hyperdense vessel or unexpected calcification. CT FACIAL BONES FINDINGS Examination of the facial bones is significantly limited by motion artifact. Skull: Normal. Negative for fracture or focal lesion. Facial bones: No displaced fractures or dislocations. Sinuses/Orbits: No acute finding. Other: Soft tissue laceration and hematoma of the forehead (series 3, image 11). Patient is edentulous. CT CERVICAL SPINE FINDINGS Examination of the cervical spine is significantly limited by motion artifact. Alignment: Normal. Skull base and vertebrae: No acute fracture. No primary bone lesion or focal pathologic process. Soft tissues and spinal canal: No prevertebral fluid or swelling. No visible canal hematoma. Disc levels: Moderate multilevel disc space height loss and osteophytosis. Upper chest: Negative. Other: None. IMPRESSION: 1. No acute intracranial pathology. Small-vessel white matter disease and small lacunar infarctions of the basal ganglia. 2. Examination of the facial bones is significantly limited by motion artifact. Within this limitation, no displaced fractures or dislocations of the facial bones. 3. Soft tissue laceration and hematoma of the forehead. 4. Examination of the cervical spine is significantly limited by motion artifact. Within this limitation, no fracture or static subluxation. 5. Moderate multilevel cervical disc degenerative disease. Electronically Signed   By: Jearld Lesch M.D.   On: 04/26/2023 12:53    Procedures Procedures    Medications Ordered in ED Medications - No data to display  ED Course/ Medical Decision Making/ A&P                             Medical Decision Making Amount and/or Complexity of Data Reviewed Radiology: ordered.   Ariana Carney is a 89 y.o. female with comorbidities that complicate  the patient evaluation including dementia who presents to the emergency department after a fall  Initial Ddx:  Facial trauma, orbital wall fracture, entrapment, TBI, C-spine injury, pelvic fracture  MDM/Course:  Initially was concerned about the above diagnoses after the patient's fall.  No features of entrapment at all on her exam.  Does appear to be a mechanical fall since she rolled out of bed.  Was complaining of hip pain on either side to different individuals so did obtain a CT of the pelvis without contrast which did not show any acute fracture but did show a mass in her pelvis.  I have discussed this with the patient's daughters who are aware that this is likely cancer.  Unclear exactly if it is gynecologic or potentially could represent colon cancer.  They will follow-up with her primary doctor about this.  No traumatic injuries noted on the CT of her head, max face, or C-spine.  Upon re-evaluation patient is doing well.  Return precautions discussed prior to discharge.  This patient presents to the ED for concern of complaints listed in HPI, this involves an extensive number of treatment options, and is a complaint that carries with it a high risk of complications and morbidity. Disposition including potential need for admission considered.   Dispo: DC Home. Return precautions discussed including, but not limited to, those listed in  the AVS. Allowed pt time to ask questions which were answered fully prior to dc.  Additional history obtained from family Records reviewed Outpatient Clinic Notes I independently reviewed the following imaging with scope of interpretation limited to determining acute life threatening conditions related to emergency care: CT Pelvis and agree with the radiologist interpretation with the following exceptions: none  I have reviewed the patients home medications and made adjustments as needed Social Determinants of health:  Elderly         Final Clinical  Impression(s) / ED Diagnoses Final diagnoses:  Fall, initial encounter  Injury of head, initial encounter  Pelvic mass    Rx / DC Orders ED Discharge Orders     None         Rondel Baton, MD 04/26/23 1446

## 2023-05-07 ENCOUNTER — Inpatient Hospital Stay (HOSPITAL_COMMUNITY)
Admission: EM | Admit: 2023-05-07 | Discharge: 2023-05-09 | DRG: 689 | Disposition: A | Discharge status: Home / Self Care | Payer: 59 | Attending: Family Medicine | Admitting: Family Medicine

## 2023-05-07 ENCOUNTER — Encounter (HOSPITAL_COMMUNITY): Payer: Self-pay

## 2023-05-07 ENCOUNTER — Other Ambulatory Visit: Payer: Self-pay

## 2023-05-07 ENCOUNTER — Emergency Department (HOSPITAL_COMMUNITY): Payer: 59

## 2023-05-07 DIAGNOSIS — E871 Hypo-osmolality and hyponatremia: Secondary | ICD-10-CM | POA: Diagnosis not present

## 2023-05-07 DIAGNOSIS — R531 Weakness: Secondary | ICD-10-CM | POA: Diagnosis not present

## 2023-05-07 DIAGNOSIS — N3000 Acute cystitis without hematuria: Principal | ICD-10-CM

## 2023-05-07 DIAGNOSIS — N179 Acute kidney failure, unspecified: Secondary | ICD-10-CM | POA: Diagnosis present

## 2023-05-07 DIAGNOSIS — E8809 Other disorders of plasma-protein metabolism, not elsewhere classified: Secondary | ICD-10-CM | POA: Diagnosis present

## 2023-05-07 DIAGNOSIS — Z853 Personal history of malignant neoplasm of breast: Secondary | ICD-10-CM

## 2023-05-07 DIAGNOSIS — R319 Hematuria, unspecified: Secondary | ICD-10-CM | POA: Diagnosis not present

## 2023-05-07 DIAGNOSIS — Z79899 Other long term (current) drug therapy: Secondary | ICD-10-CM

## 2023-05-07 DIAGNOSIS — I10 Essential (primary) hypertension: Secondary | ICD-10-CM | POA: Insufficient documentation

## 2023-05-07 DIAGNOSIS — E441 Mild protein-calorie malnutrition: Secondary | ICD-10-CM | POA: Diagnosis not present

## 2023-05-07 DIAGNOSIS — I700 Atherosclerosis of aorta: Secondary | ICD-10-CM | POA: Diagnosis not present

## 2023-05-07 DIAGNOSIS — E46 Unspecified protein-calorie malnutrition: Secondary | ICD-10-CM | POA: Diagnosis not present

## 2023-05-07 DIAGNOSIS — Z79890 Hormone replacement therapy (postmenopausal): Secondary | ICD-10-CM | POA: Diagnosis not present

## 2023-05-07 DIAGNOSIS — B9620 Unspecified Escherichia coli [E. coli] as the cause of diseases classified elsewhere: Secondary | ICD-10-CM | POA: Diagnosis present

## 2023-05-07 DIAGNOSIS — E876 Hypokalemia: Secondary | ICD-10-CM | POA: Diagnosis not present

## 2023-05-07 DIAGNOSIS — Z6822 Body mass index (BMI) 22.0-22.9, adult: Secondary | ICD-10-CM

## 2023-05-07 DIAGNOSIS — N390 Urinary tract infection, site not specified: Secondary | ICD-10-CM | POA: Diagnosis not present

## 2023-05-07 DIAGNOSIS — E860 Dehydration: Secondary | ICD-10-CM | POA: Diagnosis not present

## 2023-05-07 DIAGNOSIS — R918 Other nonspecific abnormal finding of lung field: Secondary | ICD-10-CM | POA: Diagnosis not present

## 2023-05-07 DIAGNOSIS — G9341 Metabolic encephalopathy: Secondary | ICD-10-CM | POA: Diagnosis present

## 2023-05-07 DIAGNOSIS — F17210 Nicotine dependence, cigarettes, uncomplicated: Secondary | ICD-10-CM | POA: Diagnosis present

## 2023-05-07 DIAGNOSIS — R55 Syncope and collapse: Secondary | ICD-10-CM | POA: Diagnosis not present

## 2023-05-07 DIAGNOSIS — R9389 Abnormal findings on diagnostic imaging of other specified body structures: Secondary | ICD-10-CM | POA: Diagnosis not present

## 2023-05-07 DIAGNOSIS — I129 Hypertensive chronic kidney disease with stage 1 through stage 4 chronic kidney disease, or unspecified chronic kidney disease: Secondary | ICD-10-CM | POA: Diagnosis not present

## 2023-05-07 DIAGNOSIS — K219 Gastro-esophageal reflux disease without esophagitis: Secondary | ICD-10-CM | POA: Diagnosis present

## 2023-05-07 DIAGNOSIS — E039 Hypothyroidism, unspecified: Secondary | ICD-10-CM | POA: Diagnosis present

## 2023-05-07 DIAGNOSIS — Z9012 Acquired absence of left breast and nipple: Secondary | ICD-10-CM

## 2023-05-07 DIAGNOSIS — N189 Chronic kidney disease, unspecified: Secondary | ICD-10-CM | POA: Insufficient documentation

## 2023-05-07 DIAGNOSIS — D649 Anemia, unspecified: Secondary | ICD-10-CM | POA: Diagnosis not present

## 2023-05-07 DIAGNOSIS — N1832 Chronic kidney disease, stage 3b: Secondary | ICD-10-CM | POA: Diagnosis not present

## 2023-05-07 DIAGNOSIS — Z91013 Allergy to seafood: Secondary | ICD-10-CM

## 2023-05-07 DIAGNOSIS — N3001 Acute cystitis with hematuria: Secondary | ICD-10-CM | POA: Diagnosis not present

## 2023-05-07 LAB — URINALYSIS, ROUTINE W REFLEX MICROSCOPIC
Bilirubin Urine: NEGATIVE
Glucose, UA: NEGATIVE mg/dL
Ketones, ur: NEGATIVE mg/dL
Nitrite: NEGATIVE
Protein, ur: 30 mg/dL — AB
Specific Gravity, Urine: 1.005 (ref 1.005–1.030)
WBC, UA: >50 WBC/hpf (ref 0–5)
pH: 7 (ref 5.0–8.0)

## 2023-05-07 LAB — CBC WITH DIFFERENTIAL/PLATELET
Abs Immature Granulocytes: 0.03 10*3/uL (ref 0.00–0.07)
Basophils Absolute: 0 10*3/uL (ref 0.0–0.1)
Basophils Relative: 0 %
Eosinophils Absolute: 0.1 10*3/uL (ref 0.0–0.5)
Eosinophils Relative: 1 %
HCT: 33.2 % — ABNORMAL LOW (ref 36.0–46.0)
Hemoglobin: 11.2 g/dL — ABNORMAL LOW (ref 12.0–15.0)
Immature Granulocytes: 1 %
Lymphocytes Relative: 21 %
Lymphs Abs: 1.2 10*3/uL (ref 0.7–4.0)
MCH: 27.8 pg (ref 26.0–34.0)
MCHC: 33.7 g/dL (ref 30.0–36.0)
MCV: 82.4 fL (ref 80.0–100.0)
Monocytes Absolute: 0.7 10*3/uL (ref 0.1–1.0)
Monocytes Relative: 13 %
Neutro Abs: 3.4 10*3/uL (ref 1.7–7.7)
Neutrophils Relative %: 64 %
Platelets: 255 10*3/uL (ref 150–400)
RBC: 4.03 MIL/uL (ref 3.87–5.11)
RDW: 14.3 % (ref 11.5–15.5)
WBC: 5.4 10*3/uL (ref 4.0–10.5)
nRBC: 0 % (ref 0.0–0.2)

## 2023-05-07 LAB — COMPREHENSIVE METABOLIC PANEL
ALT: 8 U/L (ref 0–44)
AST: 18 U/L (ref 15–41)
Albumin: 3.3 g/dL — ABNORMAL LOW (ref 3.5–5.0)
Alkaline Phosphatase: 71 U/L (ref 38–126)
Anion gap: 7 (ref 5–15)
BUN: 12 mg/dL (ref 8–23)
CO2: 26 mmol/L (ref 22–32)
Calcium: 8.5 mg/dL — ABNORMAL LOW (ref 8.9–10.3)
Chloride: 95 mmol/L — ABNORMAL LOW (ref 98–111)
Creatinine, Ser: 1.41 mg/dL — ABNORMAL HIGH (ref 0.44–1.00)
GFR, Estimated: 33 mL/min — ABNORMAL LOW (ref >60)
Glucose, Bld: 78 mg/dL (ref 70–99)
Potassium: 3.3 mmol/L — ABNORMAL LOW (ref 3.5–5.1)
Sodium: 128 mmol/L — ABNORMAL LOW (ref 135–145)
Total Bilirubin: 0.5 mg/dL (ref 0.3–1.2)
Total Protein: 6.6 g/dL (ref 6.5–8.1)

## 2023-05-07 LAB — TROPONIN I (HIGH SENSITIVITY)
Troponin I (High Sensitivity): 7 ng/L (ref <18)
Troponin I (High Sensitivity): 6 ng/L (ref <18)

## 2023-05-07 LAB — CBG MONITORING, ED: Glucose-Capillary: 78 mg/dL (ref 70–99)

## 2023-05-07 LAB — MAGNESIUM: Magnesium: 1.9 mg/dL (ref 1.7–2.4)

## 2023-05-07 MED ORDER — SODIUM CHLORIDE 0.9 % IV SOLN
1.0000 g | Freq: Once | INTRAVENOUS | Status: AC
  Administered 2023-05-07: 1 g via INTRAVENOUS
  Filled 2023-05-07: qty 10

## 2023-05-07 MED ORDER — LACTATED RINGERS IV BOLUS
500.0000 mL | Freq: Once | INTRAVENOUS | Status: AC
  Administered 2023-05-07: 500 mL via INTRAVENOUS

## 2023-05-07 NOTE — ED Notes (Signed)
ED Provider at bedside. 

## 2023-05-07 NOTE — ED Triage Notes (Addendum)
Pt daughter reports pt is very sluggish and complaining of body aches, had a recent fall and was seen her last week.  Daughter is concerned she could be dehydrated because she doesn't drink enough water.  Daughter also reports they found a mass between her pelvis and rectum last week when they scanned her for the fall.

## 2023-05-07 NOTE — ED Notes (Signed)
Multiple staff have attempted to get an IV on pt unsuccessfully. Another RN to [place] IV

## 2023-05-07 NOTE — H&P (Signed)
History and Physical    Patient: Ariana Carney RUE:[id number] DOB: Jun 15, 1920 DOA: 05/07/2023 DOS: the patient was seen and examined on 05/08/2023 PCP: [name], MD  Patient coming from: Home  Chief Complaint:  Chief Complaint  Patient presents with   Fatigue   HPI: Ariana Carney is a 89 y.o. female with medical history significant of hypertension, hypothyroidism, tobacco abuse who presents to the emergency department due to altered mental status.  History was obtained from ED physician and daughter at bedside.  Per report, patient lives with daughter, daughter states that patient was not communicative, she became sluggish and complaining of bodyaches.  Daughter was concerned that she was dehydrated since she has not been drinking enough water and was also concern of UTI, since current symptoms were similar to prior admission from 6/14 to 6/15 when she was admitted for UTI and was treated with Rocephin.  ED Course:  In the emergency department, BP was 99/58, other vital signs were within normal range.  Workup in the ED showed normocytic anemia, BMP showed sodium 128, potassium 3.3, chloride 95, bicarb 26, blood glucose 78, BUN 12, creatinine 1.41, albumin 3.3.  Magnesium 1.9, troponin x 2 was negative. Chest x-ray showed Question cavitary like left upper lobe airspace opacity-limited evaluation due to patient positioning/rotation. Finding likely artifact. Patient was started on IV ceftriaxone, IV LR 500 mm was provided. Hospitalist was asked to admit patient for further evaluation and management.  Review of Systems: Review of systems as noted in the HPI. All other systems reviewed and are negative.   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 She has never used smokeless tobacco. She reports that she does not drink alcohol and does not use drugs.   Allergies  Allergen Reactions   Shrimp (Diagnostic)     History reviewed. No pertinent family history.   Prior to Admission medications   Medication Sig Start Date End Date Taking? Authorizing Provider  famotidine (PEPCID) 20 MG tablet Take 1 tablet (20 mg total) by mouth 2 (two) times daily for 5 days. 05/20/21 04/04/22  Wurst, [place], PA-C  folic acid (FOLVITE) 1 MG tablet Take 1 tablet (1 mg total) by mouth daily. 04/06/22   Catarina Hartshorn, MD  gabapentin (NEURONTIN) 300 MG capsule Take 1 capsule (300 mg total) by mouth 2 (two) times daily. 04/05/22   Catarina Hartshorn, MD  levothyroxine (SYNTHROID, LEVOTHROID) 50 MCG tablet Take 50 mcg by mouth daily.      [provider]  lisinopril-hydrochlorothiazide (ZESTORETIC) 10-12.5 MG tablet Take 1 tablet by mouth daily.    [provider]  mirtazapine (REMERON SOL-TAB) 15 MG disintegrating tablet Take 15 mg by mouth at bedtime.    [provider]  oxybutynin (DITROPAN) 5 MG tablet Take 5 mg by mouth once.    [provider]  senna-docusate (SENOKOT-S) 8.6-50 MG tablet Take 1 tablet by mouth at bedtime. 12/04/22   Leath-Warren, Sadie Haber, NP  vitamin B-12 (CYANOCOBALAMIN) 500 MCG tablet Take 1 tablet (500 mcg total) by mouth daily. 04/06/22  Catarina Hartshorn, MD    Physical Exam: BP (!) 146/89 (BP Location: Left Arm)   Pulse 74   Temp 97.7 F (36.5 C) (Oral)   Resp 18   Ht 5\' 1"  (1.549 m)   Wt 54 kg   SpO2 98%   BMI 22.48 kg/m   General: 89 y.o. year-old elderly female lying in bed and in no acute distress.  Alert and oriented x1. HEENT: NCAT, EOMI, dry mucous membrane Neck: Supple, trachea medial Cardiovascular: Regular rate and rhythm with no rubs or gallops.  No thyromegaly or JVD  noted.  No lower extremity edema. 2/4 pulses in all 4 extremities. Respiratory: Clear to auscultation with no wheezes or rales. Good inspiratory effort. Abdomen: Soft, nontender nondistended with normal bowel sounds x4 quadrants. Muskuloskeletal: No cyanosis, clubbing or edema noted bilaterally Neuro: CN II-XII intact, strength 5/5 x 4, sensation, reflexes intact Skin: No ulcerative lesions noted or rashes Psychiatry: Mood is appropriate for condition and setting          Labs on Admission:  Basic Metabolic Panel: Recent Labs  Lab 05/07/23 1935  NA 128*  K 3.3*  CL 95*  CO2 26  GLUCOSE 78  BUN 12  CREATININE 1.41*  CALCIUM 8.5*  MG 1.9   Liver Function Tests: Recent Labs  Lab 05/07/23 1935  AST 18  ALT 8  ALKPHOS 71  BILITOT 0.5  PROT 6.6  ALBUMIN 3.3*   No results for input(s): "LIPASE", "AMYLASE" in the last 168 hours. No results for input(s): "AMMONIA" in the last 168 hours. CBC: Recent Labs  Lab 05/07/23 1935  WBC 5.4  NEUTROABS 3.4  HGB 11.2*  HCT 33.2*  MCV 82.4  PLT 255   Cardiac Enzymes: No results for input(s): "CKTOTAL", "CKMB", "CKMBINDEX", "TROPONINI" in the last 168 hours.  BNP (last 3 results) No results for input(s): "BNP" in the last 8760 hours.  ProBNP (last 3 results) No results for input(s): "PROBNP" in the last 8760 hours.  CBG: Recent Labs  Lab 05/07/23 1946  GLUCAP 78    Radiological Exams on Admission: DG Chest Portable 1 View  Result Date: 05/07/2023 CLINICAL DATA:  gen weakness EXAM: PORTABLE CHEST 1 VIEW COMPARISON:  Chest x-ray 04/03/2022 FINDINGS: The heart and mediastinal contours are unchanged. Aortic calcification. Elevated left hemidiaphragm. Stable subcentimeter peripheral bilateral lower lobe pulmonary nodules measuring 4 mm-likely granuloma. Question cavitary like left upper lobe airspace opacity-limited evaluation due to patient positioning/rotation. Chronic coarsened interstitial markings with no overt pulmonary  edema. No pleural effusion. No pneumothorax. No acute osseous abnormality. Surgical clips overlie the right chest like axillary region. IMPRESSION: 1. Question cavitary like left upper lobe airspace opacity-limited evaluation due to patient positioning/rotation. Finding likely artifact. Recommend repeat PA and lateral view of the chest with improved positioning. 2. Aortic Atherosclerosis (ICD10-I70.0). Electronically Signed   By: Tish Frederickson M.D.   On: 05/07/2023 20:55    EKG: I independently viewed the EKG done and my findings are as followed: Normal sinus rhythm at a rate of 72 bpm  Assessment/Plan Present on Admission:  UTI (urinary tract infection)  Acute metabolic encephalopathy  Hypokalemia  GERD (gastroesophageal reflux disease)  Hyponatremia  Acquired hypothyroidism  Principal Problem:   UTI (urinary tract infection) Active Problems:   Acute metabolic encephalopathy   Hyponatremia   GERD (gastroesophageal reflux disease)   Acquired hypothyroidism   Hypokalemia   Dehydration   Acute kidney injury superimposed on chronic kidney disease (HCC)   Essential hypertension  UTI POA  Patient was started on IV ceftriaxone, we shall continue with same at this time Blood culture done on 03/31/2023 was positive for E. coli which was sensitive to ceftriaxone Blood culture done on 12/04/2022 was positive for E. coli and this was also sensitive to ceftriaxone Urine culture done on 04/03/2022 was positive for Proteus mirabilis and this was also was sensitive to ceftriaxone Urine culture pending  Acute metabolic encephalopathy possibly secondary to above Patient was described to be lethargic  Dehydration Continue IV hydration  Hyponatremia possibly due to dehydration Continue IV hydration and continue to monitor sodium levels Urine osmolality, serum osmolality and urine sodium will be checked  Hypokalemia K+ 3.3, this will be replenished  Hypoalbuminemia possibly secondary to mild  protein calorie malnutrition Albumin 3.3, protein supplement will be provided  Acute kidney injury superimposed on CKD 3B Creatinine 1.4 (baseline creatinine of 0.9 - 0.1) Continue IV hydration Renally adjust medications, avoid nephrotoxic agents/dehydration/hypotension  Acquired hypothyroidism Continue Synthroid  GERD Continue Pepcid  Essential hypertension Continue amlodipine Zestoretic will be held at this time due to kidney status   DVT prophylaxis: Heparin subcu   Advance Care Planning: Full code (confirmed by daughter at bedside)  Consults: None  Family Communication: Daughter at bedside (all questions answered to satisfaction)  Severity of Illness: The appropriate patient status for this patient is INPATIENT. Inpatient status is judged to be reasonable and necessary in order to provide the required intensity of service to ensure the patient's safety. The patient's presenting symptoms, physical exam findings, and initial radiographic and laboratory data in the context of their chronic comorbidities is felt to place them at high risk for further clinical deterioration. Furthermore, it is not anticipated that the patient will be medically stable for discharge from the hospital within 2 midnights of admission.   * I certify that at the point of admission it is my clinical judgment that the patient will require inpatient hospital care spanning beyond 2 midnights from the point of admission due to high intensity of service, high risk for further deterioration and high frequency of surveillance required.*  Author: Frankey Shown, DO 05/08/2023 2:56 AM  For on call review [email or web address].

## 2023-05-07 NOTE — ED Provider Notes (Signed)
[place] EMERGENCY DEPARTMENT AT [facility] Provider Note   CSN: [id number] Arrival date & time: 05/07/23  1832     History  Chief Complaint  Patient presents with   Fatigue    Ariana Carney is a 89 y.o. female with PMH as listed below who presents with fatigue. Pt daughter reports pt is very sluggish and complaining of body aches, had a recent fall and was seen her last week.  Daughter is concerned she could be dehydrated because she doesn't drink enough water.  Daughter also reports they found a mass between her pelvis and rectum last week when they scanned her for the fall.  Patient had an episode of presyncope while getting out of the car with her daughters.  Since that time has been lethargic and sluggish.  Respond to questions but is not acting her normal self.  Did not lose consciousness or fall today.  No trauma today.  Has not complained of any chest pain, shortness of breath, abdominal pain.  No nausea vomiting or diarrhea.  No fevers or chills.   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 Authorizing Provider  famotidine (PEPCID) 20 MG tablet Take 1 tablet (20 mg total) by mouth 2 (two) times daily for 5 days. 05/20/21 04/04/22  Wurst, [place], PA-C  folic acid (FOLVITE) 1 MG tablet Take 1 tablet (1 mg total) by mouth daily. 04/06/22   Catarina Hartshorn, MD  gabapentin (NEURONTIN) 300 MG capsule Take 1 capsule (300 mg total) by mouth 2 (two) times daily. 04/05/22   Catarina Hartshorn, MD  levothyroxine (SYNTHROID, LEVOTHROID) 50 MCG tablet Take 50 mcg by mouth daily.      [provider]  lisinopril-hydrochlorothiazide (ZESTORETIC) 10-12.5 MG tablet Take 1 tablet by mouth daily.    [provider]   mirtazapine (REMERON SOL-TAB) 15 MG disintegrating tablet Take 15 mg by mouth at bedtime.    [provider]  oxybutynin (DITROPAN) 5 MG tablet Take 5 mg by mouth once.    [provider]  senna-docusate (SENOKOT-S) 8.6-50 MG tablet Take 1 tablet by mouth at bedtime. 12/04/22   Leath-Warren, Sadie Haber, NP  vitamin B-12 (CYANOCOBALAMIN) 500 MCG tablet Take 1 tablet (500 mcg total) by mouth daily. 04/06/22   Catarina Hartshorn, MD      Allergies    Shrimp (diagnostic)    Review of Systems   Review of Systems A 10 point review of systems was performed and is negative unless otherwise reported in HPI.  Physical Exam Updated Vital Signs BP (!) 146/89 (BP Location: Left Arm)   Pulse 74   Temp 97.7 F (36.5 C) (Oral)   Resp 18   Ht 5\' 1"  (1.549 m)   Wt 54 kg   SpO2 98%   BMI 22.48 kg/m  Physical Exam General: Normal appearing female, lying in bed.  HEENT: PERRLA, Sclera anicteric, MMM, trachea midline.  Cardiology: RRR, no murmurs/rubs/gallops. BL radial and DP pulses equal bilaterally.  Resp: Normal respiratory rate and effort. CTAB, no wheezes, rhonchi, crackles.  Abd: Soft, non-tender, non-distended. No rebound tenderness or guarding.  GU: Deferred. MSK: No peripheral edema or signs of trauma.  Extremities without deformity or TTP. No cyanosis or clubbing. Skin: warm, dry. No rashes or lesions. Back: No CVA tenderness Neuro: A&Ox1, seems lethargic but will respond to voice, CNs II-XII grossly intact. MAEs. Sensation grossly intact.   ED Results / Procedures / Treatments   Labs (all labs ordered are listed, but only abnormal results are displayed) Labs Reviewed  CBC WITH DIFFERENTIAL/PLATELET - Abnormal; Notable for the following components:      Result Value   Hemoglobin 11.2 (*)    HCT 33.2 (*)    All other components within normal limits  COMPREHENSIVE METABOLIC PANEL - Abnormal; Notable for the following components:   Sodium 128 (*)    Potassium 3.3 (*)     Chloride 95 (*)    Creatinine, Ser 1.41 (*)    Calcium 8.5 (*)    Albumin 3.3 (*)    GFR, Estimated 33 (*)    All other components within normal limits  URINALYSIS, ROUTINE W REFLEX MICROSCOPIC - Abnormal; Notable for the following components:   APPearance CLOUDY (*)    Hgb urine dipstick SMALL (*)    Protein, ur 30 (*)    Leukocytes,Ua LARGE (*)    Bacteria, UA MANY (*)    All other components within normal limits  URINE CULTURE  MAGNESIUM  CBG MONITORING, ED  TROPONIN I (HIGH SENSITIVITY)  TROPONIN I (HIGH SENSITIVITY)    EKG EKG Interpretation Date/Time:  Tuesday May 07 2023 19:22:24 EDT Ventricular Rate:  72 PR Interval:  259 QRS Duration:  90 QT Interval:  430 QTC Calculation: 471 R Axis:   23  Text Interpretation: Sinus rhythm Prolonged PR interval Confirmed by Vivi Barrack [phone]) on 05/07/2023 10:22:03 PM  Radiology DG Chest Portable 1 View  Result Date: 05/07/2023 CLINICAL DATA:  gen weakness EXAM: PORTABLE CHEST 1 VIEW COMPARISON:  Chest x-ray 04/03/2022 FINDINGS: The heart and mediastinal contours are unchanged. Aortic calcification. Elevated left hemidiaphragm. Stable subcentimeter peripheral bilateral lower lobe pulmonary nodules measuring 4 mm-likely granuloma. Question cavitary like left upper lobe airspace opacity-limited evaluation due to patient positioning/rotation. Chronic coarsened interstitial markings with no overt pulmonary edema. No pleural effusion. No pneumothorax. No acute osseous abnormality. Surgical clips overlie the right chest like axillary region. IMPRESSION: 1. Question cavitary like left upper lobe airspace opacity-limited evaluation due to patient positioning/rotation. Finding likely artifact. Recommend repeat PA and lateral view of the chest with improved positioning. 2. Aortic Atherosclerosis (ICD10-I70.0). Electronically Signed   By: Tish Frederickson M.D.   On: 05/07/2023 20:55    Procedures Procedures    Medications Ordered in  ED Medications  cefTRIAXone (ROCEPHIN) 1 g in sodium chloride 0.9 % 100 mL IVPB (0 g Intravenous Stopped 05/07/23 2311)  lactated ringers bolus 500 mL (500 mLs Intravenous New Bag/Given 05/07/23 2255)    ED Course/ Medical Decision Making/ A&P                          Medical Decision Making Amount and/or Complexity of Data Reviewed Labs: ordered. Decision-making details documented in ED Course. Radiology: ordered.  Risk Decision regarding hospitalization.    This patient presents to the ED for concern of pre-syncope, weakness, lethargy; this involves an extensive number of treatment options, and is a complaint that carries with it a high risk of complications and morbidity.  I considered the following differential and admission for this acute, potentially life threatening condition.   MDM:    Ddx of acute altered mental status/pre-syncope  considered but not limited to: -Intracranial abnormalities such as ICH, hydrocephalus, head trauma - did not fall or hit her head, NCAT, no FNDs, will hold off on CT imaging for now -Infection such as UTI, PNA - she is afebrile, HDS, lower c/f sepsis/bacteremia -No recent changes to her meds, lower c/f toxic ingestion -Electrolyte abnormalities or hyper/hypoglycemia -ACS or arrhythmia, PE - though c/o no CP/SOB, has no complaints now, is not hypoxic/tachypneic   Clinical Course as of 05/07/23 2339  Tue May 07, 2023  1951 Glucose-Capillary: 82 [HN]  2016 Sodium(!): 128 Chronic hyponatremia [HN]  2016 Troponin I (High Sensitivity): 7 Initial trop neg [HN]  2156 Urinalysis, Routine w reflex microscopic -Urine, Clean Catch(!) +UTI [HN]  2158 Patient urinated 125 cc into bedside commode. PVR bladder scan demonstrated >150 cc. In and out cath put out 225 cc of urine. No significant acute urinary retention. [HN]  2225 Patient did have urinary tract infection.  She is not currently complaining of any pain.  She has no leukocytosis, tachycardia, or  hypotension to indicate sepsis/bacteremia.  Troponin also was reassuringly flat.  She is given IV ceftriaxone.  Urine cultures drawn.  Admitted to hospitalist Dr. Thomes Dinning for further management. [HN]    Clinical Course User Index [HN] Loetta Rough, MD    Labs: I Ordered, and personally interpreted labs.  The pertinent results include:  those listed above  Imaging Studies ordered: I ordered imaging studies including CXR, CTH I independently visualized and interpreted imaging. I agree with the radiologist interpretation  Additional history obtained from daughters at bedside, chart review.    Cardiac Monitoring: The patient was maintained on a cardiac monitor.  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   Heart disease    Hypertension    Thyroid disease    Ulcer      Medicines Meds ordered this encounter  Medications   cefTRIAXone (ROCEPHIN) 1 g in sodium chloride 0.9 % 100 mL IVPB    Order Specific Question:   Antibiotic Indication:    Answer:   UTI   lactated ringers bolus 500 mL    I have reviewed the patients home medicines and have made adjustments as needed  Problem List / ED Course: Problem List Items Addressed This Visit       Genitourinary   * (Principal) UTI (urinary tract infection) - Primary   Other Visit Diagnoses     Near syncope       Weakness                       This note was created using dictation software, which may contain spelling or grammatical errors.    Loetta Rough,  MD 05/07/23 [phone]

## 2023-05-08 DIAGNOSIS — N189 Chronic kidney disease, unspecified: Secondary | ICD-10-CM | POA: Insufficient documentation

## 2023-05-08 DIAGNOSIS — I10 Essential (primary) hypertension: Secondary | ICD-10-CM | POA: Insufficient documentation

## 2023-05-08 DIAGNOSIS — N3001 Acute cystitis with hematuria: Secondary | ICD-10-CM | POA: Diagnosis not present

## 2023-05-08 DIAGNOSIS — E871 Hypo-osmolality and hyponatremia: Secondary | ICD-10-CM | POA: Diagnosis not present

## 2023-05-08 DIAGNOSIS — E860 Dehydration: Secondary | ICD-10-CM | POA: Insufficient documentation

## 2023-05-08 LAB — COMPREHENSIVE METABOLIC PANEL
ALT: 8 U/L (ref 0–44)
AST: 13 U/L — ABNORMAL LOW (ref 15–41)
Albumin: 2.6 g/dL — ABNORMAL LOW (ref 3.5–5.0)
Alkaline Phosphatase: 60 U/L (ref 38–126)
Anion gap: 6 (ref 5–15)
BUN: 11 mg/dL (ref 8–23)
CO2: 26 mmol/L (ref 22–32)
Calcium: 8 mg/dL — ABNORMAL LOW (ref 8.9–10.3)
Chloride: 97 mmol/L — ABNORMAL LOW (ref 98–111)
Creatinine, Ser: 1.13 mg/dL — ABNORMAL HIGH (ref 0.44–1.00)
GFR, Estimated: 43 mL/min — ABNORMAL LOW (ref >60)
Glucose, Bld: 79 mg/dL (ref 70–99)
Potassium: 3.2 mmol/L — ABNORMAL LOW (ref 3.5–5.1)
Sodium: 129 mmol/L — ABNORMAL LOW (ref 135–145)
Total Bilirubin: 0.4 mg/dL (ref 0.3–1.2)
Total Protein: 5.4 g/dL — ABNORMAL LOW (ref 6.5–8.1)

## 2023-05-08 LAB — CBC
HCT: 28 % — ABNORMAL LOW (ref 36.0–46.0)
Hemoglobin: 9.5 g/dL — ABNORMAL LOW (ref 12.0–15.0)
MCH: 27.6 pg (ref 26.0–34.0)
MCHC: 33.9 g/dL (ref 30.0–36.0)
MCV: 81.4 fL (ref 80.0–100.0)
Platelets: 246 10*3/uL (ref 150–400)
RBC: 3.44 MIL/uL — ABNORMAL LOW (ref 3.87–5.11)
RDW: 14.3 % (ref 11.5–15.5)
WBC: 5.8 10*3/uL (ref 4.0–10.5)
nRBC: 0 % (ref 0.0–0.2)

## 2023-05-08 LAB — PHOSPHORUS: Phosphorus: 3.3 mg/dL (ref 2.5–4.6)

## 2023-05-08 LAB — MAGNESIUM: Magnesium: 1.7 mg/dL (ref 1.7–2.4)

## 2023-05-08 LAB — OSMOLALITY: Osmolality: 270 mOsm/kg — ABNORMAL LOW (ref 275–295)

## 2023-05-08 MED ORDER — AMLODIPINE BESYLATE 5 MG PO TABS
5.0000 mg | ORAL_TABLET | Freq: Every day | ORAL | Status: DC
  Administered 2023-05-08 – 2023-05-09 (×2): 5 mg via ORAL
  Filled 2023-05-08 (×2): qty 1

## 2023-05-08 MED ORDER — POTASSIUM CHLORIDE 20 MEQ PO PACK
20.0000 meq | PACK | Freq: Once | ORAL | Status: AC
  Administered 2023-05-08: 20 meq via ORAL
  Filled 2023-05-08: qty 1

## 2023-05-08 MED ORDER — ACETAMINOPHEN 325 MG PO TABS
650.0000 mg | ORAL_TABLET | Freq: Four times a day (QID) | ORAL | Status: DC | PRN
  Administered 2023-05-08: 650 mg via ORAL
  Filled 2023-05-08: qty 2

## 2023-05-08 MED ORDER — ENSURE ENLIVE PO LIQD
237.0000 mL | Freq: Two times a day (BID) | ORAL | Status: DC
  Administered 2023-05-08 (×2): 237 mL via ORAL

## 2023-05-08 MED ORDER — LEVOTHYROXINE SODIUM 50 MCG PO TABS
50.0000 ug | ORAL_TABLET | Freq: Every day | ORAL | Status: DC
  Administered 2023-05-09: 50 ug via ORAL
  Filled 2023-05-08: qty 1

## 2023-05-08 MED ORDER — ONDANSETRON HCL 4 MG/2ML IJ SOLN: 4.0000 mg | Freq: Four times a day (QID) | INTRAMUSCULAR | Status: DC | PRN

## 2023-05-08 MED ORDER — SODIUM CHLORIDE 0.9 % IV SOLN: INTRAVENOUS | Status: DC

## 2023-05-08 MED ORDER — MAGNESIUM SULFATE 2 GM/50ML IV SOLN
2.0000 g | Freq: Once | INTRAVENOUS | Status: AC
  Administered 2023-05-08: 2 g via INTRAVENOUS
  Filled 2023-05-08: qty 50

## 2023-05-08 MED ORDER — GABAPENTIN 300 MG PO CAPS
300.0000 mg | ORAL_CAPSULE | Freq: Every day | ORAL | Status: DC
  Administered 2023-05-08 – 2023-05-09 (×2): 300 mg via ORAL
  Filled 2023-05-08 (×2): qty 1

## 2023-05-08 MED ORDER — TRAMADOL HCL 50 MG PO TABS: 25.0000 mg | ORAL_TABLET | Freq: Two times a day (BID) | ORAL | Status: DC | PRN

## 2023-05-08 MED ORDER — HEPARIN SODIUM (PORCINE) 5000 UNIT/ML IJ SOLN
5000.0000 [IU] | Freq: Three times a day (TID) | INTRAMUSCULAR | Status: DC
  Administered 2023-05-08 – 2023-05-09 (×4): 5000 [IU] via SUBCUTANEOUS
  Filled 2023-05-08 (×4): qty 1

## 2023-05-08 MED ORDER — ONDANSETRON HCL 4 MG PO TABS: 4.0000 mg | ORAL_TABLET | Freq: Four times a day (QID) | ORAL | Status: DC | PRN

## 2023-05-08 MED ORDER — ACETAMINOPHEN 650 MG RE SUPP: 650.0000 mg | Freq: Four times a day (QID) | RECTAL | Status: DC | PRN

## 2023-05-08 MED ORDER — POTASSIUM CHLORIDE CRYS ER 20 MEQ PO TBCR
40.0000 meq | EXTENDED_RELEASE_TABLET | Freq: Once | ORAL | Status: AC
  Administered 2023-05-08: 40 meq via ORAL
  Filled 2023-05-08: qty 2

## 2023-05-08 MED ORDER — SODIUM CHLORIDE 0.9 % IV SOLN
1.0000 g | INTRAVENOUS | Status: DC
  Administered 2023-05-08: 1 g via INTRAVENOUS
  Filled 2023-05-08: qty 10

## 2023-05-08 MED ORDER — FAMOTIDINE 20 MG PO TABS
20.0000 mg | ORAL_TABLET | Freq: Every day | ORAL | Status: DC
  Administered 2023-05-08 – 2023-05-09 (×2): 20 mg via ORAL
  Filled 2023-05-08 (×2): qty 1

## 2023-05-08 NOTE — Hospital Course (Signed)
89 y.o. female with medical history significant of hypertension, hypothyroidism, tobacco abuse who presents to the emergency department due to altered mental status.  History was obtained from ED physician and daughter at bedside.  Per report, patient lives with daughter, daughter states that patient was not communicative, she became sluggish and complaining of bodyaches.  Daughter was concerned that she was dehydrated since she has not been drinking enough water and was also concern of UTI, since current symptoms were similar to prior admission from 6/14 to 6/15 when she was admitted for UTI and was treated with Rocephin.   ED Course:  In the emergency department, BP was 99/58, other vital signs were within normal range.  Workup in the ED showed normocytic anemia, BMP showed sodium 128, potassium 3.3, chloride 95, bicarb 26, blood glucose 78, BUN 12, creatinine 1.41, albumin 3.3.  Magnesium 1.9, troponin x 2 was negative. Chest x-ray showed Question cavitary like left upper lobe airspace opacity-limited evaluation due to patient positioning/rotation. Finding likely artifact. Patient was started on IV ceftriaxone, IV LR 500 mm was provided. Hospitalist was asked to admit patient for further evaluation and management.

## 2023-05-08 NOTE — TOC Initial Note (Signed)
Transition of Care [facility]) - Initial/Assessment Note    Patient Details  Name: Ariana Carney MRN: [id number] Date of Birth: October 04, 1920  Transition of Care (TOC) CM/SW Contact:    Catalina Gravel, LCSW Phone Number: 05/08/2023, 2:44 PM  Clinical Narrative:                 Patient admitted with UTI from home.  Patient has high risk for readmission. CSW spoke to daughter who is contact.  Patient is 89 years old, her adult children rotate care in her home 7 days a week.  They assist with all ADL's. The DME in the home includes, cane, walker, wc, BSC.   Daughter states that pt does not want any HH services if reccommended, she wants her children to continue caring for her. Plan is for pt to return home.  The only need requested is pt has about 7 stairs leading to her home, family will explore a ramp. TOC to follow.   Expected Discharge Plan: Home/Self Care Barriers to Discharge: Continued Medical Work up   Patient Goals and CMS Choice Patient states their goals for this hospitalization and ongoing recovery are:: Return home, children care for her 7 days a week.          Expected Discharge Plan and Services In-house Referral: Clinical Social Work     Living arrangements for the past 2 months: Single Family Home                                      Prior Living Arrangements/Services Living arrangements for the past 2 months: Single Family Home Lives with:: Self, Adult Children Patient language and need for interpreter reviewed:: Yes Do you feel safe going back to the place where you live?: Yes      Need for Family Participation in Patient Care: Yes (Comment) Care giver support system in place?: Yes (comment) (Adult children provide care 7 days a week.)   Criminal Activity/Legal Involvement Pertinent to Current Situation/Hospitalization: No - Comment as needed  Activities of Daily Living Home Assistive Devices/Equipment: None ADL Screening (condition at time of  admission) Patient's cognitive ability adequate to safely complete daily activities?: Yes Is the patient deaf or have difficulty hearing?: No Does the patient have difficulty seeing, even when wearing glasses/contacts?: No Does the patient have difficulty concentrating, remembering, or making decisions?: No Patient able to express need for assistance with ADLs?: Yes Does the patient have difficulty dressing or bathing?: Yes Independently performs ADLs?: No Communication: Independent Dressing (OT): Needs assistance Is this a change from baseline?: Pre-admission baseline Grooming: Needs assistance Is this a change from baseline?: Pre-admission baseline Feeding: Needs assistance Is this a change from baseline?: Pre-admission baseline Bathing: Dependent Is this a change from baseline?: Pre-admission baseline Toileting: Needs assistance Is this a change from baseline?: Pre-admission baseline In/Out Bed: Needs assistance Is this a change from baseline?: Pre-admission baseline Walks in Home: Needs assistance Is this a change from baseline?: Pre-admission baseline Does the patient have difficulty walking or climbing stairs?: Yes Weakness of Legs: Both Weakness of Arms/Hands: Both  Permission Sought/Granted                  Emotional Assessment Appearance:: Appears stated age Attitude/Demeanor/Rapport: Unable to Assess Affect (typically observed): Unable to Assess Orientation: : Oriented to Self Alcohol / Substance Use: Never Used    Admission diagnosis:  UTI (urinary tract  infection) [N39.0] Weakness [R53.1] Acute cystitis without hematuria [N30.00] Near syncope [R55] Patient Active Problem List   Diagnosis Date Noted   Dehydration 05/08/2023   Acute kidney injury superimposed on chronic kidney disease (HCC) 05/08/2023   Essential hypertension 05/08/2023   Elevated troponin 04/04/2022   Acute metabolic encephalopathy 04/04/2022   UTI (urinary tract infection) 04/04/2022    GERD (gastroesophageal reflux disease) 04/04/2022   Acquired hypothyroidism 04/04/2022   Hyponatremia 04/04/2022   Hypokalemia 04/04/2022   Chronic kidney disease, stage 3a (HCC) 04/04/2022   Opioid dependence (HCC) 04/04/2022   Generalized weakness 04/04/2022   Tobacco abuse 04/04/2022   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 [place] [address] Phone: [phone] Fax: [phone]     Social Determinants of Health (SDOH) Social History: SDOH Screenings   Food Insecurity: No Food Insecurity (05/08/2023)  Housing: Low Risk  (05/08/2023)  Transportation Needs: No Transportation Needs (05/08/2023)  Utilities: Not At Risk (05/08/2023)  Tobacco Use: High Risk (05/07/2023)   SDOH Interventions:     Readmission Risk Interventions    05/08/2023    2:40 PM  Readmission Risk Prevention Plan  Transportation Screening Complete  PCP or Specialist Appt within 3-5 Days Complete  HRI or Home Care Consult Complete  Social Work Consult for Recovery Care Planning/Counseling Complete  Palliative Care Screening Complete  Medication Review Ocean[facility]) Complete

## 2023-05-08 NOTE — Progress Notes (Signed)
PROGRESS NOTE   Ariana Carney  HYQ:[id number] DOB: December 28, 1919 DOA: 05/07/2023 PCP: [name], MD   Chief Complaint  Patient presents with   Fatigue   Level of care: Med-Surg  Brief Admission History:  89 y.o. female with medical history significant of hypertension, hypothyroidism, tobacco abuse who presents to the emergency department due to altered mental status.  History was obtained from ED physician and daughter at bedside.  Per report, patient lives with daughter, daughter states that patient was not communicative, she became sluggish and complaining of bodyaches.  Daughter was concerned that she was dehydrated since she has not been drinking enough water and was also concern of UTI, since current symptoms were similar to prior admission from 6/14 to 6/15 when she was admitted for UTI and was treated with Rocephin.   ED Course:  In the emergency department, BP was 99/58, other vital signs were within normal range.  Workup in the ED showed normocytic anemia, BMP showed sodium 128, potassium 3.3, chloride 95, bicarb 26, blood glucose 78, BUN 12, creatinine 1.41, albumin 3.3.  Magnesium 1.9, troponin x 2 was negative. Chest x-ray showed Question cavitary like left upper lobe airspace opacity-limited evaluation due to patient positioning/rotation. Finding likely artifact. Patient was started on IV ceftriaxone, IV LR 500 mm was provided. Hospitalist was asked to admit patient for further evaluation and management.   Assessment and Plan: UTI POA Patient was started on IV ceftriaxone, plan to complete 3 full doses  Blood culture done on 03/31/2023 was positive for E. coli which was sensitive to ceftriaxone Blood culture done on 12/04/2022 was positive for E. coli and this was also sensitive to ceftriaxone Urine culture done on 04/03/2022 was positive for Proteus mirabilis and this was also was sensitive to ceftriaxone Urine culture pending   Acute metabolic encephalopathy possibly  secondary to above Resolving with supportive measures   Dehydration Continue IV hydration, reduced rate to 35 ml/hr after 10 hours   Hyponatremia possibly due to dehydration Continue IV hydration and continue to monitor sodium levels Urine osmolality, serum osmolality and urine sodium will be checked   Hypokalemia Repleted, recheck in AM with Mg   Hypoalbuminemia possibly secondary to mild protein calorie malnutrition Albumin 3.3, protein supplement will be provided   Acute kidney injury superimposed on CKD 3B Creatinine 1.4 (baseline creatinine of 0.9 - 0.1) Continue IV hydration Renally adjust medications, avoid nephrotoxic agents/dehydration/hypotension  recheck in AM   Acquired hypothyroidism Continue Synthroid   GERD Continue Pepcid   Essential hypertension Continue amlodipine Zestoretic will be held at this time due to kidney status  DVT prophylaxis: [place] heparin  Code Status: full  Family Communication:  Disposition: Status is: Inpatient Remains inpatient appropriate because: anticipate DC home in 1-2 days   Consultants:   Procedures:   Antimicrobials:  Ceftriaxone   Subjective: Ariana Carney wants some assistance to bathroom.   Objective: Vitals:   05/07/23 2257 05/07/23 2300 05/07/23 2321 05/08/23 1545  BP:   (!) 146/89 136/78  Pulse:  72 74 88  Resp:  16 18 19   Temp: 98.3 F (36.8 C)  97.7 F (36.5 C) 98.1 F (36.7 C)  TempSrc: Oral  Oral Oral  SpO2:  95% 98% 99%  Weight:   54.3 kg   Height:        Intake/Output Summary (Last 24 hours) at 05/08/2023 1723 Last data filed at 05/08/2023 1500 Gross per 24 hour  Intake 1209.75 ml  Output 200 ml  Net 1009.75 ml  Filed Weights   05/07/23 1846 05/07/23 2321  Weight: 54 kg 54.3 kg   Examination:  General exam: frail elderly female, awake, Appears calm and comfortable  Respiratory system: Clear to auscultation. Respiratory effort normal. Cardiovascular system: normal S1 & S2 heard. No JVD, murmurs,  rubs, gallops or clicks. No pedal edema. Gastrointestinal system: Abdomen is nondistended, soft and nontender. No organomegaly or masses felt. Normal bowel sounds heard. Central nervous system: Alert and oriented. No focal neurological deficits. Extremities: diffuse osteoarthritic changes, Symmetric 5 x 5 power. Skin: No rashes, lesions or ulcers. Psychiatry: Judgement and insight appear UTD. Mood & affect appropriate.   Data Reviewed: I have personally reviewed following labs and imaging studies  CBC: Recent Labs  Lab 05/07/23 1935 05/08/23 0423  WBC 5.4 5.8  NEUTROABS 3.4  --   HGB 11.2* 9.5*  HCT 33.2* 28.0*  MCV 82.4 81.4  PLT 255 246    Basic Metabolic Panel: Recent Labs  Lab 05/07/23 1935 05/08/23 0423  NA 128* 129*  K 3.3* 3.2*  CL 95* 97*  CO2 26 26  GLUCOSE 78 79  BUN 12 11  CREATININE 1.41* 1.13*  CALCIUM 8.5* 8.0*  MG 1.9 1.7  PHOS  --  3.3    CBG: Recent Labs  Lab 05/07/23 1946  GLUCAP 78    No results found for this or any previous visit (from the past 240 hour(s)).   Radiology Studies: DG Chest Portable 1 View  Result Date: 05/07/2023 CLINICAL DATA:  gen weakness EXAM: PORTABLE CHEST 1 VIEW COMPARISON:  Chest x-ray 04/03/2022 FINDINGS: The heart and mediastinal contours are unchanged. Aortic calcification. Elevated left hemidiaphragm. Stable subcentimeter peripheral bilateral lower lobe pulmonary nodules measuring 4 mm-likely granuloma. Question cavitary like left upper lobe airspace opacity-limited evaluation due to patient positioning/rotation. Chronic coarsened interstitial markings with no overt pulmonary edema. No pleural effusion. No pneumothorax. No acute osseous abnormality. Surgical clips overlie the right chest like axillary region. IMPRESSION: 1. Question cavitary like left upper lobe airspace opacity-limited evaluation due to patient positioning/rotation. Finding likely artifact. Recommend repeat PA and lateral view of the chest with  improved positioning. 2. Aortic Atherosclerosis (ICD10-I70.0). Electronically Signed   By: Tish Frederickson M.D.   On: 05/07/2023 20:55    Scheduled Meds:  amLODipine  5 mg Oral Daily   famotidine  20 mg Oral Daily   feeding supplement  237 mL Oral BID BM   gabapentin  300 mg Oral Daily   heparin  5,000 Units Subcutaneous Q8H   [START ON 05/09/2023] levothyroxine  50 mcg Oral Daily   Continuous Infusions:  sodium chloride 35 mL/hr at 05/08/23 1338   cefTRIAXone (ROCEPHIN)  IV       LOS: 1 day   Time spent: 41 mins  Laquitha Heslin Laural Benes, MD How to contact the [facility] Attending or Consulting provider 7A - 7P or covering provider during after hours 7P -7A, for this patient?  Check the care team in [facility] and look for a) attending/consulting TRH provider listed and b) the [facility] team listed Log into [email or web address] and use [place]'s universal password to access. If you do not have the password, please contact the hospital operator. Locate the [facility] provider you are looking for under [facility] and page to a number that you can be directly reached. If you still have difficulty reaching the provider, please page the [facility] (Director on Call) for the Hospitalists listed on amion for assistance.  05/08/2023, 5:23 PM

## 2023-05-08 NOTE — Plan of Care (Signed)
  Problem: Education: Goal: Knowledge of General Education information will improve Description Including pain rating scale, medication(s)/side effects and non-pharmacologic comfort measures Outcome: Progressing   

## 2023-05-08 NOTE — Plan of Care (Signed)

## 2023-05-09 DIAGNOSIS — N179 Acute kidney failure, unspecified: Secondary | ICD-10-CM | POA: Diagnosis not present

## 2023-05-09 DIAGNOSIS — G9341 Metabolic encephalopathy: Secondary | ICD-10-CM | POA: Diagnosis not present

## 2023-05-09 DIAGNOSIS — E876 Hypokalemia: Secondary | ICD-10-CM | POA: Diagnosis not present

## 2023-05-09 DIAGNOSIS — N3001 Acute cystitis with hematuria: Secondary | ICD-10-CM | POA: Diagnosis not present

## 2023-05-09 LAB — BASIC METABOLIC PANEL
Anion gap: 9 (ref 5–15)
BUN: 9 mg/dL (ref 8–23)
CO2: 20 mmol/L — ABNORMAL LOW (ref 22–32)
Calcium: 7.7 mg/dL — ABNORMAL LOW (ref 8.9–10.3)
Chloride: 102 mmol/L (ref 98–111)
Creatinine, Ser: 0.98 mg/dL (ref 0.44–1.00)
GFR, Estimated: 51 mL/min — ABNORMAL LOW (ref >60)
Glucose, Bld: 74 mg/dL (ref 70–99)
Potassium: 4.2 mmol/L (ref 3.5–5.1)
Sodium: 131 mmol/L — ABNORMAL LOW (ref 135–145)

## 2023-05-09 LAB — OSMOLALITY, URINE: Osmolality, Ur: 317 mOsm/kg (ref 300–900)

## 2023-05-09 LAB — MAGNESIUM: Magnesium: 2.2 mg/dL (ref 1.7–2.4)

## 2023-05-09 LAB — SODIUM, URINE, RANDOM: Sodium, Ur: 96 mmol/L

## 2023-05-09 MED ORDER — GABAPENTIN 300 MG PO CAPS: 300.0000 mg | ORAL_CAPSULE | Freq: Every day | ORAL | Status: AC

## 2023-05-09 MED ORDER — FAMOTIDINE 10 MG PO TABS: 10.0000 mg | ORAL_TABLET | Freq: Every day | ORAL | 1 refills | Status: DC

## 2023-05-09 MED ORDER — CEFDINIR 300 MG PO CAPS: 300.0000 mg | ORAL_CAPSULE | Freq: Every day | ORAL | 0 refills | Status: AC

## 2023-05-09 MED ORDER — AMLODIPINE BESYLATE 5 MG PO TABS: 5.0000 mg | ORAL_TABLET | Freq: Every day | ORAL | 1 refills | Status: AC

## 2023-05-09 NOTE — Discharge Instructions (Signed)
IMPORTANT INFORMATION: PAY CLOSE ATTENTION   PHYSICIAN DISCHARGE INSTRUCTIONS  Follow with Primary care provider  Fagan, Roy, MD  and other consultants as instructed by your Hospitalist Physician  SEEK MEDICAL CARE OR RETURN TO EMERGENCY ROOM IF SYMPTOMS COME BACK, WORSEN OR NEW PROBLEM DEVELOPS   Please note: You were cared for by a hospitalist during your hospital stay. Every effort will be made to forward records to your primary care provider.  You can request that your primary care provider send for your hospital records if they have not received them.  Once you are discharged, your primary care physician will handle any further medical issues. Please note that NO REFILLS for any discharge medications will be authorized once you are discharged, as it is imperative that you return to your primary care physician (or establish a relationship with a primary care physician if you do not have one) for your post hospital discharge needs so that they can reassess your need for medications and monitor your lab values.  Please get a complete blood count and chemistry panel checked by your Primary MD at your next visit, and again as instructed by your Primary MD.  Get Medicines reviewed and adjusted: Please take all your medications with you for your next visit with your Primary MD  Laboratory/radiological data: Please request your Primary MD to go over all hospital tests and procedure/radiological results at the follow up, please ask your primary care provider to get all Hospital records sent to his/her office.  In some cases, they will be blood work, cultures and biopsy results pending at the time of your discharge. Please request that your primary care provider follow up on these results.  If you are diabetic, please bring your blood sugar readings with you to your follow up appointment with primary care.    Please call and make your follow up appointments as soon as possible.    Also Note the  following: If you experience worsening of your admission symptoms, develop shortness of breath, life threatening emergency, suicidal or homicidal thoughts you must seek medical attention immediately by calling 911 or calling your MD immediately  if symptoms less severe.  You must read complete instructions/literature along with all the possible adverse reactions/side effects for all the Medicines you take and that have been prescribed to you. Take any new Medicines after you have completely understood and accpet all the possible adverse reactions/side effects.   Do not drive when taking Pain medications or sleeping medications (Benzodiazepines)  Do not take more than prescribed Pain, Sleep and Anxiety Medications. It is not advisable to combine anxiety,sleep and pain medications without talking with your primary care practitioner  Special Instructions: If you have smoked or chewed Tobacco  in the last 2 yrs please stop smoking, stop any regular Alcohol  and or any Recreational drug use.  Wear Seat belts while driving.  Do not drive if taking any narcotic, mind altering or controlled substances or recreational drugs or alcohol.       

## 2023-05-09 NOTE — Care Management Important Message (Signed)
Important Message  Patient Details  Name: MUNTAHA VERMETTE MRN: [id number] Date of Birth: 02-26-1920   Medicare Important Message Given:  N/A - LOS <3 / Initial given by admissions     Corey Harold 05/09/2023, 10:30 AM

## 2023-05-09 NOTE — Discharge Summary (Signed)
Physician Discharge Summary  CASON DABNEY ZOX:[id number] DOB: 03/25/1920 DOA: 05/07/2023  PCP: [name], MD  Admit date: 05/07/2023 Discharge date: 05/09/2023  Admitted From:  Home  Disposition: Home (declined HH)   Recommendations for Outpatient Follow-up:  Follow up with PCP in 1 weeks Please obtain BMP in one week Please follow up on the following pending results: final urine culture data   Home Health: declined  Discharge Condition: STABLE   CODE STATUS: FULL  DIET: resume previous home diet    Brief Hospitalization Summary: Please see all hospital notes, images, labs for full details of the hospitalization. ADMISSION PROVIDER HPI:  89 y.o. female with medical history significant of hypertension, hypothyroidism, tobacco abuse who presents to the emergency department due to altered mental status.  History was obtained from ED physician and daughter at bedside.  Per report, patient lives with daughter, daughter states that patient was not communicative, she became sluggish and complaining of bodyaches.  Daughter was concerned that she was dehydrated since she has not been drinking enough water and was also concern of UTI, since current symptoms were similar to prior admission from 6/14 to 6/15 when she was admitted for UTI and was treated with Rocephin.   ED Course:  In the emergency department, BP was 99/58, other vital signs were within normal range.  Workup in the ED showed normocytic anemia, BMP showed sodium 128, potassium 3.3, chloride 95, bicarb 26, blood glucose 78, BUN 12, creatinine 1.41, albumin 3.3.  Magnesium 1.9, troponin x 2 was negative.  Chest x-ray showed. Question cavitary like left upper lobe airspace opacity-limited evaluation due to patient positioning/rotation. Finding likely artifact.  Patient was started on IV ceftriaxone, IV LR 500 mm was provided.  Hospitalist was asked to admit patient for further evaluation and management.  HOSPITAL COURSE BY PROBLEM    E COLI UTI  Patient was started on IV ceftriaxone with good response and improvement in symptoms.  Final Urine culture sensitivities still pending DC home on oral cefdinir to complete 3 doses.   Acute metabolic encephalopathy possibly secondary to above Resolved with supportive measures   Dehydration - treated  - DC zestoretic at discharge due to poor oral intake especially with liquids per family    Hyponatremia due to dehydration Improved with IV hydration DC Zestoretic    Hypokalemia Repleted   Hypoalbuminemia possibly secondary to mild protein calorie malnutrition Albumin 3.3, protein supplement will be provided   Acute kidney injury superimposed on CKD 3B Improved with IV fluid hydration    Acquired hypothyroidism Continue Synthroid   GERD Continue Pepcid, reduced dose to 10 mg   Essential hypertension Continue amlodipine 5 mg daily  Zestoretic was discontinued   Discharge Diagnoses:  Principal Problem:   UTI (urinary tract infection) Active Problems:   Acute metabolic encephalopathy   GERD (gastroesophageal reflux disease)   Acquired hypothyroidism   Hyponatremia   Hypokalemia   Dehydration   Acute kidney injury superimposed on chronic kidney disease (HCC)   Essential hypertension   Discharge Instructions:  Allergies as of 05/09/2023       Reactions   Shrimp (diagnostic)         Medication List     STOP taking these medications    folic acid 1 MG tablet Commonly known as: FOLVITE   lisinopril-hydrochlorothiazide 10-12.5 MG tablet Commonly known as: ZESTORETIC   oxybutynin 5 MG tablet Commonly known as: DITROPAN   senna-docusate 8.6-50 MG tablet Commonly known as: Senokot-S  TAKE these medications    acetaminophen 325 MG tablet Commonly known as: TYLENOL Take 650 mg by mouth every 6 (six) hours as needed for mild pain or moderate pain.   amLODipine 5 MG tablet Commonly known as: NORVASC Take 1 tablet (5 mg total) by  mouth daily. Start taking on: May 10, 2023   B-12 COMPLIANCE INJECTION IJ Inject 1 Syringe as directed every 30 (thirty) days. What changed: Another medication with the same name was removed. Continue taking this medication, and follow the directions you see here.   cefdinir 300 MG capsule Commonly known as: OMNICEF Take 1 capsule (300 mg total) by mouth daily for 3 days. Start taking on: May 10, 2023   famotidine 10 MG tablet Commonly known as: PEPCID Take 1 tablet (10 mg total) by mouth daily. What changed:  medication strength how much to take when to take this   gabapentin 300 MG capsule Commonly known as: NEURONTIN Take 1 capsule (300 mg total) by mouth daily. What changed: when to take this   levothyroxine 50 MCG tablet Commonly known as: SYNTHROID Take 50 mcg by mouth daily.   mirtazapine 15 MG disintegrating tablet Commonly known as: REMERON SOL-TAB Take 15 mg by mouth at bedtime.        Follow-up Information     [name], MD Follow up in 1 week(s).   Specialty: Internal Medicine Why: Hospital Follow Up Contact information: [address] [place] [address] [phone]                Allergies  Allergen Reactions   Shrimp (Diagnostic)    Allergies as of 05/09/2023       Reactions   Shrimp (diagnostic)         Medication List     STOP taking these medications    folic acid 1 MG tablet Commonly known as: FOLVITE   lisinopril-hydrochlorothiazide 10-12.5 MG tablet Commonly known as: ZESTORETIC   oxybutynin 5 MG tablet Commonly known as: DITROPAN   senna-docusate 8.6-50 MG tablet Commonly known as: Senokot-S       TAKE these medications    acetaminophen 325 MG tablet Commonly known as: TYLENOL Take 650 mg by mouth every 6 (six) hours as needed for mild pain or moderate pain.   amLODipine 5 MG tablet Commonly known as: NORVASC Take 1 tablet (5 mg total) by mouth daily. Start taking on: May 10, 2023    B-12 COMPLIANCE INJECTION IJ Inject 1 Syringe as directed every 30 (thirty) days. What changed: Another medication with the same name was removed. Continue taking this medication, and follow the directions you see here.   cefdinir 300 MG capsule Commonly known as: OMNICEF Take 1 capsule (300 mg total) by mouth daily for 3 days. Start taking on: May 10, 2023   famotidine 10 MG tablet Commonly known as: PEPCID Take 1 tablet (10 mg total) by mouth daily. What changed:  medication strength how much to take when to take this   gabapentin 300 MG capsule Commonly known as: NEURONTIN Take 1 capsule (300 mg total) by mouth daily. What changed: when to take this   levothyroxine 50 MCG tablet Commonly known as: SYNTHROID Take 50 mcg by mouth daily.   mirtazapine 15 MG disintegrating tablet Commonly known as: REMERON SOL-TAB Take 15 mg by mouth at bedtime.        Procedures/Studies: DG Chest Portable 1 View  Result Date: 05/07/2023 CLINICAL DATA:  gen weakness EXAM: PORTABLE CHEST 1 VIEW  COMPARISON:  Chest x-ray 04/03/2022 FINDINGS: The heart and mediastinal contours are unchanged. Aortic calcification. Elevated left hemidiaphragm. Stable subcentimeter peripheral bilateral lower lobe pulmonary nodules measuring 4 mm-likely granuloma. Question cavitary like left upper lobe airspace opacity-limited evaluation due to patient positioning/rotation. Chronic coarsened interstitial markings with no overt pulmonary edema. No pleural effusion. No pneumothorax. No acute osseous abnormality. Surgical clips overlie the right chest like axillary region. IMPRESSION: 1. Question cavitary like left upper lobe airspace opacity-limited evaluation due to patient positioning/rotation. Finding likely artifact. Recommend repeat PA and lateral view of the chest with improved positioning. 2. Aortic Atherosclerosis (ICD10-I70.0). Electronically Signed   By: Tish Frederickson M.D.   On: 05/07/2023 20:55   CT PELVIS  WO CONTRAST  Result Date: 04/26/2023 CLINICAL DATA:  Fall from bed with left hip pain EXAM: CT PELVIS WITHOUT CONTRAST TECHNIQUE: Multidetector CT imaging of the pelvis was performed following the standard protocol without intravenous contrast. RADIATION DOSE REDUCTION: This exam was performed according to the departmental dose-optimization program which includes automated exposure control, adjustment of the mA and/or kV according to patient size and/or use of iterative reconstruction technique. COMPARISON:  Right hip radiographs dated 01/09/2022 FINDINGS: Urinary Tract: No focal bladder wall thickening. Bowel: Partially imaged bowel without bowel wall thickening, distention, or inflammatory changes. Vascular/Lymphatic: Aortic atherosclerosis. No enlarged abdominal or pelvic lymph nodes. Reproductive: Uterus is reportedly surgically absent. Lobulated, heterogeneous mass centered within the pelvis measures 15.3 x 9.1 cm (5:51). The mass is discrete from the posterior bladder wall and is inseparable from the anterior rectosigmoid colon. The ovaries are not discretely seen. Other: Small volume pelvic free fluid.  No free air. Musculoskeletal: Technically challenging examination due to motion artifact and diffuse osteopenia. No acute or abnormal lytic or blastic osseous lesions. IMPRESSION: 1. Technically challenging examination due to motion artifact and diffuse osteopenia. No acute fracture or traumatic malalignment. 2. Lobulated, heterogeneous mass centered within the pelvis measures 15.3 x 9.1 cm and is inseparable from the anterior rectosigmoid colon. The ovaries are not discretely seen. Uterus is reportedly surgically absent. Differential is primarily suspicious for gynecologic tumor/malignancy. Recommend subspecialty consultation to Gynecologic Oncology. 3.  Aortic Atherosclerosis (ICD10-I70.0). Electronically Signed   By: Agustin Cree M.D.   On: 04/26/2023 13:05   CT Head Wo Contrast  Result Date:  04/26/2023 CLINICAL DATA:  Fall from bed EXAM: CT HEAD WITHOUT CONTRAST CT MAXILLOFACIAL WITHOUT CONTRAST CT CERVICAL SPINE WITHOUT CONTRAST TECHNIQUE: Multidetector CT imaging of the head, cervical spine, and maxillofacial structures were performed using the standard protocol without intravenous contrast. Multiplanar CT image reconstructions of the cervical spine and maxillofacial structures were also generated. RADIATION DOSE REDUCTION: This exam was performed according to the departmental dose-optimization program which includes automated exposure control, adjustment of the mA and/or kV according to patient size and/or use of iterative reconstruction technique. COMPARISON:  01/09/2022 FINDINGS: CT HEAD FINDINGS Brain: No evidence of acute infarction, hemorrhage, hydrocephalus, extra-axial collection or mass lesion/mass effect. Periventricular white matter hypodensity. Small lacunar infarctions of the basal ganglia. Vascular: No hyperdense vessel or unexpected calcification. CT FACIAL BONES FINDINGS Examination of the facial bones is significantly limited by motion artifact. Skull: Normal. Negative for fracture or focal lesion. Facial bones: No displaced fractures or dislocations. Sinuses/Orbits: No acute finding. Other: Soft tissue laceration and hematoma of the forehead (series 3, image 11). Patient is edentulous. CT CERVICAL SPINE FINDINGS Examination of the cervical spine is significantly limited by motion artifact. Alignment: Normal. Skull base and vertebrae: No acute fracture. No primary  bone lesion or focal pathologic process. Soft tissues and spinal canal: No prevertebral fluid or swelling. No visible canal hematoma. Disc levels: Moderate multilevel disc space height loss and osteophytosis. Upper chest: Negative. Other: None. IMPRESSION: 1. No acute intracranial pathology. Small-vessel white matter disease and small lacunar infarctions of the basal ganglia. 2. Examination of the facial bones is  significantly limited by motion artifact. Within this limitation, no displaced fractures or dislocations of the facial bones. 3. Soft tissue laceration and hematoma of the forehead. 4. Examination of the cervical spine is significantly limited by motion artifact. Within this limitation, no fracture or static subluxation. 5. Moderate multilevel cervical disc degenerative disease. Electronically Signed   By: Jearld Lesch M.D.   On: 04/26/2023 12:53   CT Maxillofacial Wo Contrast  Result Date: 04/26/2023 CLINICAL DATA:  Fall from bed EXAM: CT HEAD WITHOUT CONTRAST CT MAXILLOFACIAL WITHOUT CONTRAST CT CERVICAL SPINE WITHOUT CONTRAST TECHNIQUE: Multidetector CT imaging of the head, cervical spine, and maxillofacial structures were performed using the standard protocol without intravenous contrast. Multiplanar CT image reconstructions of the cervical spine and maxillofacial structures were also generated. RADIATION DOSE REDUCTION: This exam was performed according to the departmental dose-optimization program which includes automated exposure control, adjustment of the mA and/or kV according to patient size and/or use of iterative reconstruction technique. COMPARISON:  01/09/2022 FINDINGS: CT HEAD FINDINGS Brain: No evidence of acute infarction, hemorrhage, hydrocephalus, extra-axial collection or mass lesion/mass effect. Periventricular white matter hypodensity. Small lacunar infarctions of the basal ganglia. Vascular: No hyperdense vessel or unexpected calcification. CT FACIAL BONES FINDINGS Examination of the facial bones is significantly limited by motion artifact. Skull: Normal. Negative for fracture or focal lesion. Facial bones: No displaced fractures or dislocations. Sinuses/Orbits: No acute finding. Other: Soft tissue laceration and hematoma of the forehead (series 3, image 11). Patient is edentulous. CT CERVICAL SPINE FINDINGS Examination of the cervical spine is significantly limited by motion artifact.  Alignment: Normal. Skull base and vertebrae: No acute fracture. No primary bone lesion or focal pathologic process. Soft tissues and spinal canal: No prevertebral fluid or swelling. No visible canal hematoma. Disc levels: Moderate multilevel disc space height loss and osteophytosis. Upper chest: Negative. Other: None. IMPRESSION: 1. No acute intracranial pathology. Small-vessel white matter disease and small lacunar infarctions of the basal ganglia. 2. Examination of the facial bones is significantly limited by motion artifact. Within this limitation, no displaced fractures or dislocations of the facial bones. 3. Soft tissue laceration and hematoma of the forehead. 4. Examination of the cervical spine is significantly limited by motion artifact. Within this limitation, no fracture or static subluxation. 5. Moderate multilevel cervical disc degenerative disease. Electronically Signed   By: Jearld Lesch M.D.   On: 04/26/2023 12:53   CT Cervical Spine Wo Contrast  Result Date: 04/26/2023 CLINICAL DATA:  Fall from bed EXAM: CT HEAD WITHOUT CONTRAST CT MAXILLOFACIAL WITHOUT CONTRAST CT CERVICAL SPINE WITHOUT CONTRAST TECHNIQUE: Multidetector CT imaging of the head, cervical spine, and maxillofacial structures were performed using the standard protocol without intravenous contrast. Multiplanar CT image reconstructions of the cervical spine and maxillofacial structures were also generated. RADIATION DOSE REDUCTION: This exam was performed according to the departmental dose-optimization program which includes automated exposure control, adjustment of the mA and/or kV according to patient size and/or use of iterative reconstruction technique. COMPARISON:  01/09/2022 FINDINGS: CT HEAD FINDINGS Brain: No evidence of acute infarction, hemorrhage, hydrocephalus, extra-axial collection or mass lesion/mass effect. Periventricular white matter hypodensity. Small lacunar infarctions of the  basal ganglia. Vascular: No hyperdense  vessel or unexpected calcification. CT FACIAL BONES FINDINGS Examination of the facial bones is significantly limited by motion artifact. Skull: Normal. Negative for fracture or focal lesion. Facial bones: No displaced fractures or dislocations. Sinuses/Orbits: No acute finding. Other: Soft tissue laceration and hematoma of the forehead (series 3, image 11). Patient is edentulous. CT CERVICAL SPINE FINDINGS Examination of the cervical spine is significantly limited by motion artifact. Alignment: Normal. Skull base and vertebrae: No acute fracture. No primary bone lesion or focal pathologic process. Soft tissues and spinal canal: No prevertebral fluid or swelling. No visible canal hematoma. Disc levels: Moderate multilevel disc space height loss and osteophytosis. Upper chest: Negative. Other: None. IMPRESSION: 1. No acute intracranial pathology. Small-vessel white matter disease and small lacunar infarctions of the basal ganglia. 2. Examination of the facial bones is significantly limited by motion artifact. Within this limitation, no displaced fractures or dislocations of the facial bones. 3. Soft tissue laceration and hematoma of the forehead. 4. Examination of the cervical spine is significantly limited by motion artifact. Within this limitation, no fracture or static subluxation. 5. Moderate multilevel cervical disc degenerative disease. Electronically Signed   By: Jearld Lesch M.D.   On: 04/26/2023 12:53     Subjective: Pt says she wishes to go home today, she has been eating and drinking well.  Daughters at bedside, no specific complaints.    Discharge Exam: Vitals:   05/09/23 0317 05/09/23 0800  BP: 106/63 139/79  Pulse: 69   Resp: 16   Temp: 98.6 F (37 C)   SpO2: 98%    Vitals:   05/08/23 1545 05/08/23 2046 05/09/23 0317 05/09/23 0800  BP: 136/78 122/61 106/63 139/79  Pulse: 88 72 69   Resp: 19 18 16    Temp: 98.1 F (36.7 C) 97.6 F (36.4 C) 98.6 F (37 C)   TempSrc: Oral Oral     SpO2: 99% 100% 98%   Weight:      Height:        General: Pt is alert, awake, not in acute distress Cardiovascular: RRR, S1/S2 +, no rubs, no gallops Respiratory: CTA bilaterally, no wheezing, no rhonchi Abdominal: Soft, NT, ND, bowel sounds + Extremities: no edema, no cyanosis   The results of significant diagnostics from this hospitalization (including imaging, microbiology, ancillary and laboratory) are listed below for reference.     Microbiology: Recent Results (from the past 240 hour(s))  Urine Culture     Status: Abnormal (Preliminary result)   Collection Time: 05/07/23  9:57 PM   Specimen: Urine, Clean Catch  Result Value Ref Range Status   Specimen Description   Final    URINE, CLEAN CATCH Performed at [facility], [address], [place], [place] [address]    Special Requests   Final    NONE Performed at [facility], [address] [place], [place] [address]    Culture (A)  Final    >=100,000 COLONIES/mL ESCHERICHIA COLI CULTURE REINCUBATED FOR BETTER GROWTH SUSCEPTIBILITIES TO FOLLOW Performed at [facility], [address], [place], [place] [address]    Report Status PENDING  Incomplete     Labs: BNP (last 3 results) No results for input(s): "BNP" in the last 8760 hours. Basic Metabolic Panel: Recent Labs  Lab 05/07/23 1935 05/08/23 0423 05/09/23 0421  NA 128* 129* 131*  K 3.3* 3.2* 4.2  CL 95* 97* 102  CO2 26 26 20*  GLUCOSE 78 79 74  BUN 12 11  9  CREATININE 1.41* 1.13* 0.98  CALCIUM 8.5* 8.0* 7.7*  MG 1.9 1.7 2.2  PHOS  --  3.3  --    Liver Function Tests: Recent Labs  Lab 05/07/23 1935 05/08/23 0423  AST 18 13*  ALT 8 8  ALKPHOS 71 60  BILITOT 0.5 0.4  PROT 6.6 5.4*  ALBUMIN 3.3* 2.6*   No results for input(s): "LIPASE", "AMYLASE" in the last 168 hours. No results for input(s): "AMMONIA" in the last 168 hours. CBC: Recent Labs  Lab 05/07/23 1935 05/08/23 0423  WBC 5.4 5.8  NEUTROABS 3.4  --   HGB 11.2* 9.5*  HCT  33.2* 28.0*  MCV 82.4 81.4  PLT 255 246   Cardiac Enzymes: No results for input(s): "CKTOTAL", "CKMB", "CKMBINDEX", "TROPONINI" in the last 168 hours. BNP: Invalid input(s): "POCBNP" CBG: Recent Labs  Lab 05/07/23 1946  GLUCAP 78   D-Dimer No results for input(s): "DDIMER" in the last 72 hours. Hgb A1c No results for input(s): "HGBA1C" in the last 72 hours. Lipid Profile No results for input(s): "CHOL", "HDL", "LDLCALC", "TRIG", "CHOLHDL", "LDLDIRECT" in the last 72 hours. Thyroid function studies No results for input(s): "TSH", "T4TOTAL", "T3FREE", "THYROIDAB" in the last 72 hours.  Invalid input(s): "FREET3" Anemia work up No results for input(s): "VITAMINB12", "FOLATE", "FERRITIN", "TIBC", "IRON", "RETICCTPCT" in the last 72 hours. Urinalysis    Component Value Date/Time   COLORURINE YELLOW 05/07/2023 2053   APPEARANCEUR CLOUDY (A) 05/07/2023 2053   LABSPEC 1.005 05/07/2023 2053   PHURINE 7.0 05/07/2023 2053   GLUCOSEU NEGATIVE 05/07/2023 2053   HGBUR SMALL (A) 05/07/2023 2053   BILIRUBINUR NEGATIVE 05/07/2023 2053   BILIRUBINUR negative 03/31/2023 1347   KETONESUR NEGATIVE 05/07/2023 2053   PROTEINUR 30 (A) 05/07/2023 2053   UROBILINOGEN 1.0 03/31/2023 1347   NITRITE NEGATIVE 05/07/2023 2053   LEUKOCYTESUR LARGE (A) 05/07/2023 2053   Sepsis Labs Recent Labs  Lab 05/07/23 1935 05/08/23 0423  WBC 5.4 5.8   Microbiology Recent Results (from the past 240 hour(s))  Urine Culture     Status: Abnormal (Preliminary result)   Collection Time: 05/07/23  9:57 PM   Specimen: Urine, Clean Catch  Result Value Ref Range Status   Specimen Description   Final    URINE, CLEAN CATCH Performed at [facility], [address] [place], [place] [address]    Special Requests   Final    NONE Performed at [facility], [address] [place], [place] [address]    Culture (A)  Final    >=100,000 COLONIES/mL ESCHERICHIA COLI CULTURE REINCUBATED FOR BETTER  GROWTH SUSCEPTIBILITIES TO FOLLOW Performed at [facility], [address] [place], [place] [address]    Report Status PENDING  Incomplete   Time coordinating discharge:  35 mins   SIGNED:  Standley Dakins, MD  [facility] 05/09/2023, 10:25 AM How to contact the [facility] Attending or Consulting provider 7A - 7P or covering provider during after hours 7P -7A, for this patient?  Check the care team in [facility] and look for a) attending/consulting TRH provider listed and b) the [facility] team listed Log into [email or web address] and use [facility]'s universal password to access. If you do not have the password, please contact the hospital operator. Locate the [facility] provider you are looking for under [facility] and page to a number that you can be directly reached. If you still have difficulty reaching the provider, please page the [facility] (Director on Call) for the Hospitalists listed on amion  for assistance.

## 2023-05-10 LAB — URINE CULTURE: Culture: >=100000 — AB

## 2023-05-13 NOTE — Consult Note (Signed)
[facility] Customer service manager [facility]) Accountable Care Organization (ACO) [facility] Liaison Note  05/13/2023  [name] 1920/06/24 [id number]  Location: [facility] RN Hospital Liaison screened the patient remotely at [facility].  Insurance: [employer]   Ariana Carney is a 89 y.o. female who is a Primary Care Patient of [name], MD. The patient was screened for readmission hospitalization with noted high risk score for unplanned readmission risk with 1 IP/1 ED in 6 months.  The patient was assessed for potential [facility]) Care Management service needs for post hospital transition for care coordination. Review of patient's electronic medical record reveals patient was admitted for acute cystitis without hematuria. TOC notes indicate pt declined [facility] with several children who rotate the pt's care with ADLs and all appointments. No anticipated needs at this time of discharge.    [facility] Care Management/Population Health does not replace or interfere with any arrangements made by the Inpatient Transition of Care team.   For questions contact:   Elliot Cousin, RN, [facility] Liaison [place]   Population Health Office Hours MTWF  8:00 am-6:00 pm Off on Thursday [phone] mobile [phone] [Office toll free line] Office Hours are M-F 8:30 - 5 pm 24 hour nurse advise line [phone] Concierge  [email or web address]

## 2023-05-14 DIAGNOSIS — N390 Urinary tract infection, site not specified: Secondary | ICD-10-CM | POA: Diagnosis not present

## 2023-05-14 DIAGNOSIS — Z823 Family history of stroke: Secondary | ICD-10-CM | POA: Diagnosis not present

## 2023-05-14 DIAGNOSIS — E860 Dehydration: Secondary | ICD-10-CM | POA: Diagnosis not present

## 2023-05-14 DIAGNOSIS — I639 Cerebral infarction, unspecified: Secondary | ICD-10-CM | POA: Diagnosis not present

## 2023-05-14 DIAGNOSIS — R1900 Intra-abdominal and pelvic swelling, mass and lump, unspecified site: Secondary | ICD-10-CM | POA: Diagnosis not present

## 2023-05-17 ENCOUNTER — Telehealth: Payer: Self-pay

## 2023-05-17 NOTE — Telephone Encounter (Signed)
Called patients daughter Peggie to schedule new patient appointment.  No voicemail available was full.  Need to schedule on 8/2 with Dr Pricilla Holm

## 2023-05-22 ENCOUNTER — Encounter: Payer: Self-pay | Admitting: Gynecologic Oncology

## 2023-05-22 NOTE — Progress Notes (Signed)
GYNECOLOGIC ONCOLOGY NEW PATIENT CONSULTATION   Patient Name: Ariana Carney  Patient Age: 89 y.o. Date of Service: 05/24/23 Referring Provider: [name], MD [address] [place],  [place] [address]   Primary Care Provider: [name], MD Consulting Provider: Eugene Garnet, MD   Assessment/Plan:  Postmenopausal patient with a complex pelvic mass.  Patient presented with 2 of her children as well as her granddaughter today, who is a Engineer, civil (consulting) within the [facility] system.  We spent some time discussing findings on recent CT scan which showed an incidental pelvic mass.  We looked at the images together.  I discussed possible etiologies which include but this is a mass arising from one of her adnexa (she thinks both of her ovaries were left at the time of her hysterectomy), uterine-like tissue (despite hysterectomy, patients can have recurrence of masses that appear pathologically consistent with uterine origin), or that this is a mass arising from other structures in her pelvis.  I discussed my concerns with the family and patient that there is very likely at least attachment of this large mass to her rectosigmoid colon and may be some superficial invasion given loss of fat plane on imaging.  We discussed what surgery would involve in the setting of a mass with these imaging findings.  Given what would likely be a need for rectosigmoid resection in the setting of her advanced age and other medical comorbidities, I recommend against any surgical intervention.  We discussed other possible interventions including additional imaging such as an MRI or a PET scan to get more information about this tumor.  We also discussed the possibility of a percutaneous biopsy for diagnostic purposes.  Either imaging or biopsy would give [place] additional information but not change my recommendation against definitive surgical intervention.  Luckily, the patient is not symptomatic at this time.  We discussed that over  the next coming weeks to months, she likely will develop some symptoms.  The patient's family has already spoken about wishes with regard to her care for this incidentally found mass.  They do not wish to proceed with any further diagnostic workup and agree with my recommendation against surgery.  I discussed referral to hospice.  Although this may not be necessary at this time, given what I suspect will be development of symptoms over the coming weeks to months, I think that getting hospice set up now would be very helpful to both the patient and her family.  The family was open to this suggestion and would be interested in working with [facility].  I have asked Clydie Braun, our nurse navigator, to reach out to Dr. Alonza Smoker office the event that he prefers to put this referral in.  Otherwise, we will place this referral later today.  A copy of this note was sent to the patient's referring provider.   65 minutes of total time was spent for this patient encounter, including preparation, face-to-face counseling with the patient and coordination of care, and documentation of the encounter.  Eugene Garnet, MD  Division of Gynecologic Oncology  Department of Obstetrics and Gynecology  [facility]  ___________________________________________  Chief Complaint: Chief Complaint  Patient presents with   Pelvic mass    History of Present Illness:  Ariana Carney is a 89 y.o. y.o. female who is seen in consultation at the request of [name], MD for an evaluation of a complex pelvic mass.  Patient presented to the emergency department after a fall in early  July with head trauma.  Underwent CT imaging of her head and given report of hip pain, CT of the pelvis was obtained with incidental finding of a large pelvic mass.  The patient was then recently admitted to the hospital in the setting of urinary tract infection after she developed altered mental status.  CT of the  pelvis without contrast on 7/5 shows lobulated, heterogenous mass centered within the pelvis measuring 13.3 x 9.1 cm.  Well-defined plane with the bladder, inseparable from the anterior rectosigmoid colon.  Ovaries not discretely seen.  Uterus reportedly surgically absent.  Small volume free fluid.  No adenopathy.  The patient presents today with one of her daughters.  She denies any abdominal or pelvic pain.  She endorses good appetite.  Has had some slow weight gain over time.  Denies any recent weight changes.  Endorses bowel function every 1-3 days, stool is soft and formed, no recent change to bowel function.  Has some urinary incontinence at night or if she cannot make it to the bathroom, otherwise denies urinary symptoms.  Denies any vaginal bleeding or discharge.  Patient has some short-term memory loss.  She lives alone but somebody is with her 24 hours a day helping with cooking, cleaning, bathing, and other activities.  She is able to dress herself.  Children all help to take care of her.  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 Patient has had a hysterectomy.  Age at menarche: Unsure, as a teenager Age at menopause:  unsure Hx of HRT: denies Hx of STDs: denies Last pap: unsure History of abnormal pap smears: denies  SCREENING STUDIES:  Last mammogram: 2023  Last colonoscopy: years ago  MEDICATIONS: Outpatient Encounter Medications as of 05/24/2023  Medication Sig   acetaminophen (TYLENOL) 325 MG tablet Take 650 mg by mouth every 6 (six) hours as needed for mild pain or moderate pain.   amLODipine (NORVASC) 5 MG tablet Take 1 tablet (5 mg total) by mouth daily.   Cyanocobalamin (B-12 COMPLIANCE INJECTION IJ) Inject 1 Syringe as directed every 30 (thirty) days.   gabapentin (NEURONTIN) 300  MG capsule Take 1 capsule (300 mg total) by mouth daily.   levothyroxine (SYNTHROID, LEVOTHROID) 50 MCG tablet Take 50 mcg by mouth daily.     mirtazapine (REMERON SOL-TAB) 15 MG disintegrating tablet Take 15 mg by mouth at bedtime.   [DISCONTINUED] famotidine (PEPCID) 10 MG tablet Take 1 tablet (10 mg total) by mouth daily.   No facility-administered encounter medications on file as of 05/24/2023.    ALLERGIES:  Allergies  Allergen Reactions   Shrimp (Diagnostic)      FAMILY HISTORY:  History reviewed. No pertinent family history.   SOCIAL HISTORY:  Social Connections: Not on file    REVIEW OF SYSTEMS:  Denies appetite changes, fevers, chills, fatigue, unexplained weight changes. Denies hearing loss, neck lumps or masses, mouth sores, ringing in ears or voice changes. Denies cough or wheezing.  Denies shortness of breath. Denies chest pain or palpitations. Denies leg swelling. Denies abdominal distention, pain, blood in stools, constipation, diarrhea, nausea, vomiting, or early satiety. Denies pain with intercourse, dysuria, frequency, hematuria or incontinence. Denies hot flashes, pelvic pain, vaginal bleeding or vaginal discharge.   Denies joint pain, back pain or muscle pain/cramps. Denies itching, rash, or wounds. Denies dizziness, headaches, numbness or seizures. Denies swollen lymph nodes or  glands, denies easy bruising or bleeding. Denies anxiety, depression or decreased concentration.  Physical Exam:  Vital Signs for this encounter:  Blood pressure (!) 98/54, pulse 81, temperature 98.7 F (37.1 C), temperature source Oral, resp. rate 14, height 5\' 1"  (1.549 m), SpO2 99%. Body mass index is 22.62 kg/m. General: Alert, oriented, no acute distress.  HEENT: Normocephalic, atraumatic. Sclera anicteric.  Chest: Clear to auscultation bilaterally.  Mild inspiratory wheezing bilaterally. Cardiovascular: Regular rate and rhythm, no murmurs, rubs, or gallops.  Abdomen: Normoactive bowel sounds. Soft, nondistended, nontender to palpation. No masses or hepatosplenomegaly appreciated. No palpable fluid wave.  Extremities: Grossly normal range of motion. Warm, well perfused. 1+ edema bilaterally.  Skin: No rashes or lesions.  Lymphatics: No cervical, supraclavicular, or inguinal adenopathy.  GU:  Normal external female genitalia. No lesions. No discharge or bleeding.             Bladder/urethra:  No lesions or masses, well supported bladder             Vagina: Mildly atrophic, no lesions noted.             Cervix/uterus: surgically absent.             Adnexa: Firm mass better appreciated on rectovaginal exam that presses on the rectum without direct invasion, moves with the rectum.  Mass does not feel to be attached to the vaginal cuff.  It is smooth, no nodularity appreciated.   Rectal: See above.  LABORATORY AND RADIOLOGIC DATA:  Outside medical records were reviewed to synthesize the above history, along with the history and physical obtained during the visit.   Lab Results  Component Value Date   WBC 5.8 05/08/2023   HGB 9.5 (L) 05/08/2023   HCT 28.0 (L) 05/08/2023   PLT 246 05/08/2023   GLUCOSE 74 05/09/2023   ALT 8 05/08/2023   AST 13 (L) 05/08/2023   NA 131 (L) 05/09/2023   K 4.2 05/09/2023   CL 102 05/09/2023   CREATININE 0.98 05/09/2023   BUN 9 05/09/2023   CO2 20  (L) 05/09/2023   TSH 1.541 04/04/2022

## 2023-05-24 ENCOUNTER — Other Ambulatory Visit: Payer: Self-pay

## 2023-05-24 ENCOUNTER — Ambulatory Visit: Payer: 59

## 2023-05-24 ENCOUNTER — Telehealth: Payer: Self-pay | Admitting: Oncology

## 2023-05-24 ENCOUNTER — Inpatient Hospital Stay: Payer: 59 | Attending: Gynecologic Oncology | Admitting: Gynecologic Oncology

## 2023-05-24 ENCOUNTER — Encounter: Payer: Self-pay | Admitting: Gynecologic Oncology

## 2023-05-24 VITALS — BP 98/54 | HR 81 | Temp 98.7°F | Resp 14 | Ht 61.0 in

## 2023-05-24 DIAGNOSIS — R413 Other amnesia: Secondary | ICD-10-CM | POA: Diagnosis not present

## 2023-05-24 DIAGNOSIS — R54 Age-related physical debility: Secondary | ICD-10-CM | POA: Diagnosis not present

## 2023-05-24 DIAGNOSIS — Z79890 Hormone replacement therapy (postmenopausal): Secondary | ICD-10-CM | POA: Insufficient documentation

## 2023-05-24 DIAGNOSIS — Z853 Personal history of malignant neoplasm of breast: Secondary | ICD-10-CM | POA: Insufficient documentation

## 2023-05-24 DIAGNOSIS — Z79899 Other long term (current) drug therapy: Secondary | ICD-10-CM | POA: Insufficient documentation

## 2023-05-24 DIAGNOSIS — R1900 Intra-abdominal and pelvic swelling, mass and lump, unspecified site: Secondary | ICD-10-CM | POA: Diagnosis not present

## 2023-05-24 DIAGNOSIS — E079 Disorder of thyroid, unspecified: Secondary | ICD-10-CM | POA: Diagnosis not present

## 2023-05-24 DIAGNOSIS — I10 Essential (primary) hypertension: Secondary | ICD-10-CM | POA: Diagnosis not present

## 2023-05-24 NOTE — Patient Instructions (Signed)
It was very to meet you today.  Clydie Braun, my nurse navigator, will reach out to Dr. Alonza Smoker office to discuss hospice referral.  If Dr. Ouida Sills would like [place] to place the referral, we will put in a referral for [facility].   Please do not hesitate to reach out if you need anything.

## 2023-05-24 NOTE — Telephone Encounter (Signed)
Called Dr. Alonza Smoker office and they would like [place] to place the referral for hospice.  Called in referral to [facility].  They will reach out to patient's daughter, Ariana Carney, to schedule a visit.

## 2023-06-04 ENCOUNTER — Telehealth: Payer: Self-pay | Admitting: Oncology

## 2023-06-04 NOTE — Telephone Encounter (Signed)
Called Ariana Carney (daughter) to see how Ariana Carney is doing.  She said Ariana Carney has good days and bad days. She doesn't have any pain and slept until 1:30 yesterday.  Ariana Carney and her sister are taking turns staying with Ariana Carney and hospice is seeing her today. Advised her to call if they need anything.

## 2023-06-18 DIAGNOSIS — B0223 Postherpetic polyneuropathy: Secondary | ICD-10-CM | POA: Diagnosis not present

## 2023-06-18 DIAGNOSIS — R1900 Intra-abdominal and pelvic swelling, mass and lump, unspecified site: Secondary | ICD-10-CM | POA: Diagnosis not present

## 2023-10-02 ENCOUNTER — Ambulatory Visit: Payer: 59 | Admitting: Podiatry

## 2024-09-08 ENCOUNTER — Ambulatory Visit: Admitting: Podiatry

## 2024-09-08 ENCOUNTER — Encounter: Payer: Self-pay | Admitting: Podiatry

## 2024-09-08 DIAGNOSIS — M79674 Pain in right toe(s): Secondary | ICD-10-CM

## 2024-09-08 DIAGNOSIS — B351 Tinea unguium: Secondary | ICD-10-CM | POA: Diagnosis not present

## 2024-09-08 DIAGNOSIS — M79675 Pain in left toe(s): Secondary | ICD-10-CM | POA: Diagnosis not present

## 2024-09-08 NOTE — Progress Notes (Signed)
  Subjective:  Patient ID: Ariana Carney, female    DOB: 01-03-1920,   MRN: [id number]  Chief Complaint  Patient presents with   Nail Problem    Cut them toenails.    89 y.o. female presents for concern of thickened elongated and painful nails that are difficult to trim. Requesting to have them trimmed today.   PCP:  Sheryle Carwin, MD    . Denies any other pedal complaints. Denies n/v/f/c.   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 CFT <3 seconds. Feet cold to touch. Absent hair growth on digits. Edema noted to bilateral lower extremities. Xerosis noted bilaterally.  Skin. No lacerations or abrasions bilateral feet. Nails 1-5 bilateral  are thickened discolored and elongated with subungual debris.  Musculoskeletal: MMT 5/5 bilateral lower extremities in DF, PF, Inversion and Eversion. Deceased ROM in DF of ankle joint.  Neurological: Sensation intact to light touch. Protective sensation diminished bilateral.    Assessment:   1. Pain due to onychomycosis of toenails of both feet      Plan:  Patient was evaluated and treated and all questions answered. ABN signed.  -Mechanically debrided all nails 1-5 bilateral using sterile nail nipper and filed with dremel without incident  -Answered all patient questions -Patient to return  in 3 months for at risk foot care -Patient advised to call the office if any problems or questions arise in the meantime.   Asberry Failing, DPM

## 2024-12-09 ENCOUNTER — Ambulatory Visit: Admitting: Podiatry
